# Patient Record
Sex: Female | Born: 1965 | Race: White | Hispanic: No | Marital: Married | Smoking: Former smoker
Health system: Southern US, Community
[De-identification: ages and names within clinical notes are randomized; demographics above are authoritative.]

## PROBLEM LIST (undated history)

## (undated) DIAGNOSIS — F419 Anxiety disorder, unspecified: Secondary | ICD-10-CM

## (undated) DIAGNOSIS — F329 Major depressive disorder, single episode, unspecified: Secondary | ICD-10-CM

## (undated) DIAGNOSIS — G43909 Migraine, unspecified, not intractable, without status migrainosus: Secondary | ICD-10-CM

## (undated) DIAGNOSIS — T7840XA Allergy, unspecified, initial encounter: Secondary | ICD-10-CM

## (undated) DIAGNOSIS — Z973 Presence of spectacles and contact lenses: Secondary | ICD-10-CM

## (undated) DIAGNOSIS — N939 Abnormal uterine and vaginal bleeding, unspecified: Secondary | ICD-10-CM

## (undated) DIAGNOSIS — Z975 Presence of (intrauterine) contraceptive device: Secondary | ICD-10-CM

## (undated) DIAGNOSIS — M26609 Unspecified temporomandibular joint disorder, unspecified side: Secondary | ICD-10-CM

## (undated) DIAGNOSIS — F32A Depression, unspecified: Secondary | ICD-10-CM

## (undated) DIAGNOSIS — E0510 Thyrotoxicosis with toxic single thyroid nodule without thyrotoxic crisis or storm: Secondary | ICD-10-CM

## (undated) HISTORY — DX: Major depressive disorder, single episode, unspecified: F32.9

## (undated) HISTORY — PX: FRACTURE SURGERY: SHX138

## (undated) HISTORY — DX: Allergy, unspecified, initial encounter: T78.40XA

## (undated) HISTORY — DX: Presence of (intrauterine) contraceptive device: Z97.5

## (undated) HISTORY — DX: Depression, unspecified: F32.A

## (undated) HISTORY — PX: WISDOM TOOTH EXTRACTION: SHX21

## (undated) HISTORY — DX: Unspecified temporomandibular joint disorder, unspecified side: M26.609

## (undated) HISTORY — DX: Anxiety disorder, unspecified: F41.9

---

## 2008-04-30 ENCOUNTER — Other Ambulatory Visit: Admission: RE | Admit: 2008-04-30 | Discharge: 2008-04-30 | Payer: Self-pay | Admitting: Obstetrics & Gynecology

## 2008-07-12 DIAGNOSIS — Z975 Presence of (intrauterine) contraceptive device: Secondary | ICD-10-CM

## 2008-07-12 HISTORY — DX: Presence of (intrauterine) contraceptive device: Z97.5

## 2009-07-25 ENCOUNTER — Ambulatory Visit (HOSPITAL_COMMUNITY): Admission: RE | Admit: 2009-07-25 | Discharge: 2009-07-25 | Payer: Self-pay | Admitting: Obstetrics & Gynecology

## 2010-08-04 ENCOUNTER — Other Ambulatory Visit: Payer: Self-pay | Admitting: Obstetrics & Gynecology

## 2010-08-04 DIAGNOSIS — Z1231 Encounter for screening mammogram for malignant neoplasm of breast: Secondary | ICD-10-CM

## 2010-08-04 DIAGNOSIS — Z0000 Encounter for general adult medical examination without abnormal findings: Secondary | ICD-10-CM

## 2010-09-14 ENCOUNTER — Ambulatory Visit (HOSPITAL_COMMUNITY)
Admission: RE | Admit: 2010-09-14 | Discharge: 2010-09-14 | Disposition: A | Discharge status: Home / Self Care | Payer: BC Managed Care – PPO | Source: Ambulatory Visit | Attending: Obstetrics & Gynecology | Admitting: Obstetrics & Gynecology

## 2010-09-14 DIAGNOSIS — Z1231 Encounter for screening mammogram for malignant neoplasm of breast: Secondary | ICD-10-CM

## 2010-11-09 ENCOUNTER — Encounter: Payer: BC Managed Care – PPO | Attending: Obstetrics and Gynecology | Admitting: *Deleted

## 2010-11-09 DIAGNOSIS — R635 Abnormal weight gain: Secondary | ICD-10-CM | POA: Insufficient documentation

## 2010-11-09 DIAGNOSIS — Z713 Dietary counseling and surveillance: Secondary | ICD-10-CM | POA: Insufficient documentation

## 2010-12-19 ENCOUNTER — Other Ambulatory Visit: Payer: Self-pay | Admitting: Obstetrics & Gynecology

## 2010-12-19 DIAGNOSIS — Z1231 Encounter for screening mammogram for malignant neoplasm of breast: Secondary | ICD-10-CM

## 2011-01-01 ENCOUNTER — Ambulatory Visit: Payer: BC Managed Care – PPO | Admitting: *Deleted

## 2011-09-24 ENCOUNTER — Ambulatory Visit (HOSPITAL_COMMUNITY): Payer: BC Managed Care – PPO

## 2011-09-27 ENCOUNTER — Ambulatory Visit (HOSPITAL_COMMUNITY)
Admission: RE | Admit: 2011-09-27 | Discharge: 2011-09-27 | Disposition: A | Discharge status: Home / Self Care | Payer: BC Managed Care – PPO | Source: Ambulatory Visit | Attending: Obstetrics & Gynecology | Admitting: Obstetrics & Gynecology

## 2011-09-27 DIAGNOSIS — Z1231 Encounter for screening mammogram for malignant neoplasm of breast: Secondary | ICD-10-CM | POA: Insufficient documentation

## 2012-07-31 ENCOUNTER — Encounter: Payer: Self-pay | Admitting: *Deleted

## 2012-08-01 ENCOUNTER — Encounter: Payer: Self-pay | Admitting: *Deleted

## 2012-08-04 ENCOUNTER — Other Ambulatory Visit: Payer: Self-pay | Admitting: Certified Nurse Midwife

## 2012-08-04 DIAGNOSIS — Z1231 Encounter for screening mammogram for malignant neoplasm of breast: Secondary | ICD-10-CM

## 2012-09-30 ENCOUNTER — Ambulatory Visit (HOSPITAL_COMMUNITY)
Admission: RE | Admit: 2012-09-30 | Discharge: 2012-09-30 | Disposition: A | Discharge status: Home / Self Care | Payer: BC Managed Care – PPO | Source: Ambulatory Visit | Attending: Certified Nurse Midwife | Admitting: Certified Nurse Midwife

## 2012-09-30 ENCOUNTER — Encounter: Payer: Self-pay | Admitting: Certified Nurse Midwife

## 2012-09-30 ENCOUNTER — Other Ambulatory Visit: Payer: Self-pay | Admitting: Certified Nurse Midwife

## 2012-09-30 ENCOUNTER — Ambulatory Visit (INDEPENDENT_AMBULATORY_CARE_PROVIDER_SITE_OTHER): Payer: BC Managed Care – PPO | Admitting: Certified Nurse Midwife

## 2012-09-30 VITALS — BP 120/70 | Ht 63.75 in | Wt 149.0 lb

## 2012-09-30 DIAGNOSIS — Z1231 Encounter for screening mammogram for malignant neoplasm of breast: Secondary | ICD-10-CM | POA: Insufficient documentation

## 2012-09-30 DIAGNOSIS — R599 Enlarged lymph nodes, unspecified: Secondary | ICD-10-CM

## 2012-09-30 LAB — CBC WITH DIFFERENTIAL/PLATELET
Basophils Relative: 1 % (ref 0–1)
Eosinophils Relative: 1 % (ref 0–5)
HCT: 36.2 % (ref 36.0–46.0)
Hemoglobin: 12.3 g/dL (ref 12.0–15.0)
Lymphocytes Relative: 23 % (ref 12–46)
MCHC: 34 g/dL (ref 30.0–36.0)
MCV: 86.2 fL (ref 78.0–100.0)
Monocytes Absolute: 0.4 10*3/uL (ref 0.1–1.0)
Monocytes Relative: 8 % (ref 3–12)
Neutro Abs: 3.8 10*3/uL (ref 1.7–7.7)
RDW: 13.7 % (ref 11.5–15.5)

## 2012-09-30 LAB — POCT URINALYSIS DIPSTICK
Bilirubin, UA: NEGATIVE
Blood, UA: NEGATIVE
Glucose, UA: NEGATIVE

## 2012-09-30 MED ORDER — VITAMIN D (ERGOCALCIFEROL) 1.25 MG (50000 UNIT) PO CAPS: 50000.0000 [IU] | ORAL_CAPSULE | ORAL | Status: DC

## 2012-09-30 NOTE — Patient Instructions (Signed)
Swollen Lymph Nodes The lymphatic system filters fluid from around cells. It is like a system of blood vessels. These channels carry lymph instead of blood. The lymphatic system is an important part of the immune (disease fighting) system. When people talk about "swollen glands in the neck," they are usually talking about swollen lymph nodes. The lymph nodes are like the little traps for infection. You and your caregiver may be able to feel lymph nodes, especially swollen nodes, in these common areas: the groin (inguinal area), armpits (axilla), and above the clavicle (supraclavicular). You may also feel them in the neck (cervical) and the back of the head just above the hairline (occipital). Swollen glands occur when there is any condition in which the body responds with an allergic type of reaction. For instance, the glands in the neck can become swollen from insect bites or any type of minor infection on the head. These are very noticeable in children with only minor problems. Lymph nodes may also become swollen when there is a tumor or problem with the lymphatic system, such as Hodgkin's disease. TREATMENT   Most swollen glands do not require treatment. They can be observed (watched) for a short period of time, if your caregiver feels it is necessary. Most of the time, observation is not necessary.  Antibiotics (medicines that kill germs) may be prescribed by your caregiver. Your caregiver may prescribe these if he or she feels the swollen glands are due to a bacterial (germ) infection. Antibiotics are not used if the swollen glands are caused by a virus. HOME CARE INSTRUCTIONS   Take medications as directed by your caregiver. Only take over-the-counter or prescription medicines for pain, discomfort, or fever as directed by your caregiver. SEEK MEDICAL CARE IF:   If you begin to run a temperature greater than 102 F (38.9 C), or as your caregiver suggests. MAKE SURE YOU:   Understand these  instructions.  Will watch your condition.  Will get help right away if you are not doing well or get worse. Document Released: 06/22/2002 Document Revised: 09/24/2011 Document Reviewed: 07/02/2005 [facility] Patient Information 2013 [place], [place].

## 2012-09-30 NOTE — Addendum Note (Signed)
Addended by: Verner Chol on: 09/30/2012 04:06 PM   Modules accepted: Orders

## 2012-09-30 NOTE — Progress Notes (Addendum)
47 y.o. MarriedCaucasian female   [phone] here for annual exam. Noted right inguinal lymph node enlarged.  Denies soreness, tenderness or redness.  Shaves once weekly. No other health issues today   Patient's last menstrual period was 09/28/2012.,usually scant with IUD          Sexually active: yes  The current method of family planning is IUD.    Exercising: yes  The patient has a physically strenuous job, but has no regular exercise apart from work.  Hormones:none   reports that she has quit smoking. She does not have any smokeless tobacco history on file. She reports that she does not drink alcohol or use illicit drugs.  Health Maintenance  Topic Date Due  . Influenza Vaccine  03/16/1969  . Pap Smear  07/10/1986  . Tetanus/tdap  07/11/1987    Family History  Problem Relation Age of Onset  . Breast cancer Mother   . Diabetes Paternal Grandfather   . Kidney cancer Paternal Grandmother   . Hypertension Paternal Grandfather   . Hypertension Paternal Grandmother   . Hypertension Father   . COPD Mother     There is no problem list on file for this patient.   Past Medical History  Diagnosis Date  . IUD (intrauterine device) in place 07/12/2008    Expires 07/12/2013  . Anxiety   . Depression     Past Surgical History  Procedure Laterality Date  . Cesarean section      two of them    Allergies: Review of patient's allergies indicates no known allergies.  Current Outpatient Prescriptions  Medication Sig Dispense Refill  . Acetaminophen (TYLENOL PO) Take by mouth as needed.      . B Complex Vitamins (B COMPLEX PO) Take by mouth as needed.      Marland Kitchen CALCIUM PO Take by mouth.      . Cyanocobalamin (B-12 PO) Take by mouth.      . levonorgestrel (MIRENA) 20 MCG/24HR IUD 1 each by Intrauterine route once.      Marland Kitchen MAGNESIUM PO Take by mouth.      . Multiple Vitamins-Minerals (MULTIVITAMIN PO) Take by mouth.      . Omega-3 Fatty Acids (FISH OIL PO) Take by mouth.      .  Vitamin D, Ergocalciferol, (DRISDOL) 50000 UNITS CAPS Take 50,000 Units by mouth once a week.       No current facility-administered medications for this visit.    ROS: Pertinent items are noted in HPI.  Exam:    BP 120/70  Ht 5' 3.75" (1.619 m)  Wt 149 lb (67.586 kg)  BMI 25.78 kg/m2  LMP 09/28/2012  Weight change: @WEIGHTCHANGE @ Height:   Height: 5' 3.75" (161.9 cm)   General appearance: alert, cooperative and appears stated age Head: Normocephalic, without obvious abnormality, atraumatic Neck: no adenopathy, supple, symmetrical, trachea midline and thyroid not enlarged, symmetric, no tenderness/mass/nodules Lungs: clear to auscultation bilaterally Breasts: Inspection negative, No nipple retraction or dimpling, No nipple discharge or bleeding, No axillary or supraclavicular adenopathy, Normal to palpation without dominant masses Heart: regular rate and rhythm Abdomen: soft, non-tender; bowel sounds normal; no masses,  no organomegaly Extremities: extremities normal, atraumatic, no cyanosis or edema Skin: Skin color, texture, turgor normal. No rashes or lesions Lymph nodes: Cervical, supraclavicular, and axillary nodes normal. No abnormal inguinal nodes palpated,Right inquinal lymph node slightly enlarged, non-tender. Pubic area shaved recently Neurologic: Grossly normal   Pelvic: External genitalia:  no lesions  Urethra:  normal appearing urethra with no masses, tenderness or lesions              Bartholins and Skenes: normal                 Vagina: normal appearing vagina with normal color and discharge, no lesions              Cervix: normal appearance, IUD string noted in cervical os              Pap taken: no        Bimanual Exam:  Uterus:  uterus is normal size, shape, consistency and nontender                                      Adnexa: normal adnexa in size, nontender and no masses                                      Rectovaginal: Confirms                                       Anus:  normal sphincter tone, no lesions  A: well woman Exam Right inguinal lymph node slight enlargement probably second degree to shaving History of Vitamin D deficiency      P:Wellness information reviewed Epsom salt soak to inguinal area, notify if not resolved in one week, avoid shaving CBC with diff Vitamin D  mammogram return annually or prn     An After Visit Summary was printed and given to the patient.  Chart reviewed CPRomine, MD

## 2012-10-01 LAB — HEMOGLOBIN, FINGERSTICK: Hemoglobin, fingerstick: 12.4 g/dL (ref 12.0–16.0)

## 2012-10-08 ENCOUNTER — Telehealth: Payer: Self-pay

## 2012-10-08 NOTE — Telephone Encounter (Signed)
Patient notified of normal cbc with diff results. Pt also notified of vitamin d results & told to take between 600-800 iu of vitamin d daily.

## 2013-03-26 ENCOUNTER — Telehealth: Payer: Self-pay | Admitting: Certified Nurse Midwife

## 2013-03-26 NOTE — Telephone Encounter (Signed)
Patient is calling about a billing issue with an annual back in March.

## 2013-04-22 NOTE — Telephone Encounter (Signed)
Patient is calling for Mary Bolton she wanted to let you know that her insurance has mailed payment to [place] for the billing issue.

## 2013-05-21 ENCOUNTER — Other Ambulatory Visit: Payer: Self-pay

## 2013-05-28 ENCOUNTER — Telehealth: Payer: Self-pay | Admitting: Certified Nurse Midwife

## 2013-05-28 NOTE — Telephone Encounter (Signed)
Patient wants to make an appt to come in and have her old IUD removed and a new on replaced.

## 2013-05-28 NOTE — Telephone Encounter (Signed)
Mary Bolton, patient is due for Mirena Removal on 12/28. She is requesting appointment for removal and reinsert. Is this okay to schedule? Last AEX 09/30/12.

## 2013-05-29 NOTE — Telephone Encounter (Signed)
Spoke with patient. Appointment scheduled and instructions given.  Motrin instructions given.   Motrin=Advil=Ibuprofen  800 mg one hour before procedure. Eat lunch and hydrate well before appointment.   Carolynn can you precert.

## 2013-05-29 NOTE — Telephone Encounter (Signed)
Message left to return call to [place] at [phone].   Will schedule IUD Removal and Reinsertion at return call.

## 2013-05-29 NOTE — Telephone Encounter (Signed)
Pt is returning a call to Vevay. She is at work and asking to be called back between 4-5pm today.

## 2013-05-29 NOTE — Telephone Encounter (Signed)
yes

## 2013-06-01 ENCOUNTER — Other Ambulatory Visit: Payer: Self-pay | Admitting: Certified Nurse Midwife

## 2013-06-01 DIAGNOSIS — Z30433 Encounter for removal and reinsertion of intrauterine contraceptive device: Secondary | ICD-10-CM

## 2013-06-01 NOTE — Telephone Encounter (Signed)
done

## 2013-06-01 NOTE — Telephone Encounter (Signed)
Mary Bolton,   Please place an order for insertion. I have to have an order in the workqueue in order to precert the patient. I do not use the insurance forms anymore (all info goes in EPIC).   Thanks

## 2013-07-07 ENCOUNTER — Ambulatory Visit (INDEPENDENT_AMBULATORY_CARE_PROVIDER_SITE_OTHER): Payer: BC Managed Care – PPO | Admitting: Certified Nurse Midwife

## 2013-07-07 ENCOUNTER — Encounter: Payer: Self-pay | Admitting: Certified Nurse Midwife

## 2013-07-07 VITALS — BP 130/84 | HR 72 | Resp 16 | Ht 63.75 in | Wt 143.0 lb

## 2013-07-07 DIAGNOSIS — N760 Acute vaginitis: Secondary | ICD-10-CM

## 2013-07-07 DIAGNOSIS — Z30433 Encounter for removal and reinsertion of intrauterine contraceptive device: Secondary | ICD-10-CM

## 2013-07-07 MED ORDER — METRONIDAZOLE 0.75 % VA GEL: VAGINAL | Status: DC

## 2013-07-07 NOTE — Progress Notes (Signed)
47 y.o. Married Caucasian female G2P2002 here removal and insertion of Mirena IUD. Mirena due for removal 07/12/13. Patient had noted increase vaginal discharge in past few days, light odor, no itching. No new personal products. Patient had read and signed consent for IUD removal and reinsertion. Last aex 3/14.  O: Healthy WD,WN female Affect:normal, orientation x 3  Skin:warm and dry Abdomen: non tender Pelvic exam:EXTERNAL GENITALIA: normal appearing vulva with no masses, tenderness or lesions VAGINA: discharge grey, watery with odor, Wet Prep/KOH positive clue cells and ph 5.5 CERVIX: no lesions or cervical motion tenderness and cervix nulliparous appearance with no IUD string seen, could  Not pass pap brush to try to sweep string out. Os very tight. Dr Farrel Gobble consulted and agrees that patient needs BV treated and will need Cytotec prior to attempt to retrieve IUD and may need PUS for string location.  UTERUS: mid and normal, non tender ADNEXA: no masses palpable and non tender  A:Mirena IUD removal and insertion cancelled due to unable to locate string with cervical appearance BV  P: Discussed findings of IUD string not seen and  BV present and could not place until treated. Patient voiced understanding. Rx Metrogel see order. Patient will notify when completed medication and will reschedule IUD removal and new insertion. Patient will need Cytotec prior to procedure. Patient understands, importance and possible PUS maybe needed. IUD will be schedule with Dr Farrel Gobble   RV as above

## 2013-07-08 NOTE — Patient Instructions (Signed)
Bacterial Vaginosis Bacterial vaginosis (BV) is a vaginal infection where the normal balance of bacteria in the vagina is disrupted. The normal balance is then replaced by an overgrowth of certain bacteria. There are several different kinds of bacteria that can cause BV. BV is the most common vaginal infection in women of childbearing age. CAUSES   The cause of BV is not fully understood. BV develops when there is an increase or imbalance of harmful bacteria.  Some activities or behaviors can upset the normal balance of bacteria in the vagina and put women at increased risk including:  Having a new sex partner or multiple sex partners.  Douching.  Using an intrauterine device (IUD) for contraception.  It is not clear what role sexual activity plays in the development of BV. However, women that have never had sexual intercourse are rarely infected with BV. Women do not get BV from toilet seats, bedding, swimming pools or from touching objects around them.  SYMPTOMS   Grey vaginal discharge.  A fish-like odor with discharge, especially after sexual intercourse.  Itching or burning of the vagina and vulva.  Burning or pain with urination.  Some women have no signs or symptoms at all. DIAGNOSIS  Your caregiver must examine the vagina for signs of BV. Your caregiver will perform lab tests and look at the sample of vaginal fluid through a microscope. They will look for bacteria and abnormal cells (clue cells), a pH test higher than 4.5, and a positive amine test all associated with BV.  RISKS AND COMPLICATIONS   Pelvic inflammatory disease (PID).  Infections following gynecology surgery.  Developing HIV.  Developing herpes virus. TREATMENT  Sometimes BV will clear up without treatment. However, all women with symptoms of BV should be treated to avoid complications, especially if gynecology surgery is planned. Female partners generally do not need to be treated. However, BV may spread  between female sex partners so treatment is helpful in preventing a recurrence of BV.   BV may be treated with antibiotics. The antibiotics come in either pill or vaginal cream forms. Either can be used with nonpregnant or pregnant women, but the recommended dosages differ. These antibiotics are not harmful to the baby.  BV can recur after treatment. If this happens, a second round of antibiotics will often be prescribed.  Treatment is important for pregnant women. If not treated, BV can cause a premature delivery, especially for a pregnant woman who had a premature birth in the past. All pregnant women who have symptoms of BV should be checked and treated.  For chronic reoccurrence of BV, treatment with a type of prescribed gel vaginally twice a week is helpful. HOME CARE INSTRUCTIONS   Finish all medication as directed by your caregiver.  Do not have sex until treatment is completed.  Tell your sexual partner that you have a vaginal infection. They should see their caregiver and be treated if they have problems, such as a mild rash or itching.  Practice safe sex. Use condoms. Only have 1 sex partner. PREVENTION  Basic prevention steps can help reduce the risk of upsetting the natural balance of bacteria in the vagina and developing BV:  Do not have sexual intercourse (be abstinent).  Do not douche.  Use all of the medicine prescribed for treatment of BV, even if the signs and symptoms go away.  Tell your sex partner if you have BV. That way, they can be treated, if needed, to prevent reoccurrence. SEEK MEDICAL CARE IF:     Your symptoms are not improving after 3 days of treatment.  You have increased discharge, pain, or fever. MAKE SURE YOU:   Understand these instructions.  Will watch your condition.  Will get help right away if you are not doing well or get worse. FOR MORE INFORMATION  Division of STD Prevention (DSTDP), Centers for Disease Control and Prevention:  www.cdc.gov/std American Social Health Association (ASHA): www.ashastd.org  Document Released: 07/02/2005 Document Revised: 09/24/2011 Document Reviewed: 02/11/2013 [employer] Patient Information 2014 [employer].  

## 2013-07-13 ENCOUNTER — Telehealth: Payer: Self-pay | Admitting: Certified Nurse Midwife

## 2013-07-13 DIAGNOSIS — Z3049 Encounter for surveillance of other contraceptives: Secondary | ICD-10-CM

## 2013-07-13 MED ORDER — MISOPROSTOL 200 MCG PO TABS: ORAL_TABLET | ORAL | Status: DC

## 2013-07-13 NOTE — Telephone Encounter (Signed)
Pt would like an appointment to have an iud removed and replaced.

## 2013-07-13 NOTE — Telephone Encounter (Signed)
Yes.  Encounter closed. 

## 2013-07-13 NOTE — Telephone Encounter (Signed)
Patient calling to schedule IUD replacement.  Has completed Metrogel.  Was told needed medication or cervix.  Patient was seen by Debbi last week for this but string was hard to see and patient had BV so was treated with Metrogel.  Instructed Cytotec will be sent to pharmacy. Instructed to insert one table vaginally this PM and in am.  Instructed may have cramping. Mortin 800 mg 1 hour prior to appointment with food.   Routing to provider for final review. Patient agreeable to disposition.   Agree?

## 2013-07-13 NOTE — Progress Notes (Signed)
Reviewed personally.  M. Suzanne Milly Goggins, MD.  

## 2013-07-14 ENCOUNTER — Ambulatory Visit (INDEPENDENT_AMBULATORY_CARE_PROVIDER_SITE_OTHER): Payer: BC Managed Care – PPO | Admitting: Obstetrics & Gynecology

## 2013-07-14 VITALS — BP 160/94 | HR 96 | Temp 98.9°F | Wt 142.2 lb

## 2013-07-14 DIAGNOSIS — Z975 Presence of (intrauterine) contraceptive device: Secondary | ICD-10-CM | POA: Insufficient documentation

## 2013-07-14 DIAGNOSIS — Z3049 Encounter for surveillance of other contraceptives: Secondary | ICD-10-CM

## 2013-07-14 DIAGNOSIS — Z30433 Encounter for removal and reinsertion of intrauterine contraceptive device: Secondary | ICD-10-CM

## 2013-07-14 NOTE — Patient Instructions (Signed)

## 2013-07-14 NOTE — Progress Notes (Signed)
47 y.o. G84P2002 Married Caucasian female presents for removal of and insertion of new Mirena.  This will be her third IUD.  She has done well with the IUD.  She has eseentially no bleeding with the mirena.  She only spots if she is under a lot of stress.  Currently, she denies any vaginal symptoms or STD concerns.  LMP:  Random spotting with IUD  Healthy female:  WNWD WF NAD Abdomen: soft, non-tender  Pelvic exam: Vulva:  normal female genitalia Vagina:normal vagina Cervix:Non-tender, Negative CMT, no lesions or redness, os small, no IUD string noted Uterus:normal shape, position and consistency   After patient read information booklet and all questions were answered, informed consent was obtained.    Procedure:  Speculum inserted into vagina. Cervix visualized and cleansed with betadine solution X 3. Paracervical block was not placed.  Tenaculum placed on cervix at 12 o'clock position.  Cervix dilated with Milex dilator.  String located with string finder.  IUD removed with one pull.  Pt did not want to look at the IUD before discarding.  Uterus sounded to 8 centimeters.  IUD removed from sterile packet and under sterile conditions inserted to fundus of uterus.  Introducer removed without difficulty.  IUD string trimmed to 2 centimeters.  Remainder string given to patient to feel for identification.  Tenaculum removed.  MInimal bleeding noted.  Speculum removed.  Uterus palpated normal.  Patient tolerated procedure well.  IUD Lot #:[id number].  Exp: 8/16.  Package information attached to consent and scanned into EPIC.  A: Insertion of Mirena   P: Return to office 6 weeks for recheck Pt knows IUD needs to be replaced approximately 07/14/18.  Instructions provided.  Card given.

## 2013-07-29 ENCOUNTER — Encounter: Payer: Self-pay | Admitting: Certified Nurse Midwife

## 2013-08-04 ENCOUNTER — Other Ambulatory Visit: Payer: Self-pay | Admitting: Obstetrics & Gynecology

## 2013-08-04 DIAGNOSIS — Z1231 Encounter for screening mammogram for malignant neoplasm of breast: Secondary | ICD-10-CM

## 2013-08-31 ENCOUNTER — Telehealth: Payer: Self-pay | Admitting: Obstetrics & Gynecology

## 2013-08-31 ENCOUNTER — Ambulatory Visit: Payer: BC Managed Care – PPO | Admitting: Obstetrics & Gynecology

## 2013-08-31 NOTE — Telephone Encounter (Signed)
That is fine to wait since she is without problems.  Let her know to call if anything is abnormal/out of the ordinary.  We can always see her then.  Thanks.  Encounter closed.

## 2013-08-31 NOTE — Telephone Encounter (Signed)
Patient calling to see if it is okay for her to cancel her IUD recheck appointment for today due to inclement weather and have the IUD recheck at her AEX 10/02/13? If not, she will keep her appointment today, weather permitting.

## 2013-08-31 NOTE — Telephone Encounter (Signed)
Spoke with pt about cancelling appt today. Pt lives in [place] and doesn't want to risk getting caught in bad weather. Pt reports no problems with IUD. No pain or bleeding. This is pt's third IUD, and she says it is just like the other 2 she's had. Advised it is probably fine just to wait until her Aex 10-02-13 for a recheck, but I would check with Dr. Sabra Heck to be sure. OK for pt to wait until Aex for IUD recheck?

## 2013-09-01 NOTE — Telephone Encounter (Signed)
LM on VM (first and last name confirmation on VM). LM dr Sabra Heck agreeable for recheck at AEX.  Call back if any problems before then.

## 2013-10-01 ENCOUNTER — Ambulatory Visit: Payer: BC Managed Care – PPO | Admitting: Certified Nurse Midwife

## 2013-10-02 ENCOUNTER — Encounter: Payer: Self-pay | Admitting: Obstetrics & Gynecology

## 2013-10-02 ENCOUNTER — Other Ambulatory Visit: Payer: Self-pay | Admitting: Obstetrics & Gynecology

## 2013-10-02 ENCOUNTER — Ambulatory Visit (HOSPITAL_COMMUNITY): Payer: BC Managed Care – PPO

## 2013-10-02 ENCOUNTER — Ambulatory Visit (INDEPENDENT_AMBULATORY_CARE_PROVIDER_SITE_OTHER): Payer: BC Managed Care – PPO | Admitting: Obstetrics & Gynecology

## 2013-10-02 VITALS — BP 140/88 | HR 93 | Resp 18 | Ht 64.0 in | Wt 144.0 lb

## 2013-10-02 DIAGNOSIS — Z124 Encounter for screening for malignant neoplasm of cervix: Secondary | ICD-10-CM

## 2013-10-02 DIAGNOSIS — Z01419 Encounter for gynecological examination (general) (routine) without abnormal findings: Secondary | ICD-10-CM

## 2013-10-02 DIAGNOSIS — Z0000 Encounter for general adult medical examination without abnormal findings: Secondary | ICD-10-CM

## 2013-10-02 LAB — HEMOGLOBIN, FINGERSTICK: HEMOGLOBIN, FINGERSTICK: 13.2 g/dL (ref 12.0–16.0)

## 2013-10-02 LAB — COMPREHENSIVE METABOLIC PANEL
ALK PHOS: 76 U/L (ref 39–117)
ALT: 17 U/L (ref 0–35)
AST: 23 U/L (ref 0–37)
Albumin: 4.3 g/dL (ref 3.5–5.2)
BILIRUBIN TOTAL: 0.5 mg/dL (ref 0.2–1.2)
BUN: 9 mg/dL (ref 6–23)
CO2: 30 mEq/L (ref 19–32)
CREATININE: 0.8 mg/dL (ref 0.50–1.10)
Calcium: 9.5 mg/dL (ref 8.4–10.5)
Chloride: 97 mEq/L (ref 96–112)
Glucose, Bld: 169 mg/dL — ABNORMAL HIGH (ref 70–99)
Potassium: 3.2 mEq/L — ABNORMAL LOW (ref 3.5–5.3)
Sodium: 136 mEq/L (ref 135–145)
Total Protein: 7.3 g/dL (ref 6.0–8.3)

## 2013-10-02 LAB — LIPID PANEL
CHOL/HDL RATIO: 2.6 ratio
CHOLESTEROL: 169 mg/dL (ref 0–200)
HDL: 65 mg/dL (ref >39)
LDL CALC: 89 mg/dL (ref 0–99)
Triglycerides: 76 mg/dL (ref <150)
VLDL: 15 mg/dL (ref 0–40)

## 2013-10-02 LAB — POCT URINALYSIS DIPSTICK
Bilirubin, UA: NEGATIVE
Blood, UA: NEGATIVE
GLUCOSE UA: NEGATIVE
Ketones, UA: NEGATIVE
LEUKOCYTES UA: NEGATIVE
NITRITE UA: NEGATIVE
Protein, UA: NEGATIVE
UROBILINOGEN UA: NEGATIVE
pH, UA: 5

## 2013-10-02 LAB — TSH: TSH: 0.897 u[IU]/mL (ref 0.350–4.500)

## 2013-10-02 NOTE — Progress Notes (Signed)
48 y.o. [id number] MarriedCaucasianF here for annual exam.  Doing well.  Pretty sure she had a tetanus shot with pertussis at PCP's office within the last three years.  Not really bleeding at all.  Has Mirena IUD 07/14/13.    Working on weight loss.  Gained weight due to stress and family illnesses.  Now really careful with diet.  Has increased exercise.  Wants to lose 5-10 more pounds.    No LMP recorded. Patient is not currently having periods (Reason: IUD).          Sexually active: yes  The current method of family planning is IUD.    Exercising: no  The patient does not participate in regular exercise at present. Smoker:  no  Health Maintenance: Pap:  09/2011 - Neg. HR HPV: Neg. History of abnormal Pap:  no MMG:  09/2012 BI-RADS 1 : Neg, had MMG scheduled today but equipment went down.  She will call to reschedule. Colonoscopy:  Never BMD:   Never TDaP:  She thinks within the last three years Screening Labs: today, Hb today: 13.2, Urine today: All neg.   reports that she has quit smoking. She has never used smokeless tobacco. She reports that she does not drink alcohol or use illicit drugs.  Past Medical History  Diagnosis Date  . IUD (intrauterine device) in place 07/12/2008    Expires 07/12/2013  . Anxiety   . Depression   . IUD (intrauterine device) in place 07/14/2013    Exp. 07/14/2018  . TMJ (temporomandibular joint disorder)     Past Surgical History  Procedure Laterality Date  . Cesarean section      two of them    Current Outpatient Prescriptions  Medication Sig Dispense Refill  . Acetaminophen (TYLENOL PO) Take by mouth as needed.      Marland Kitchen CALCIUM PO Take by mouth.      . Cholecalciferol (VITAMIN D PO) Take by mouth daily.      Marland Kitchen levonorgestrel (MIRENA) 20 MCG/24HR IUD 1 each by Intrauterine route once.      Marland Kitchen MAGNESIUM PO Take by mouth.      . Multiple Vitamins-Minerals (MULTIVITAMIN PO) Take by mouth.      . Omega-3 Fatty Acids (FISH OIL PO) Take by mouth.        No current facility-administered medications for this visit.    Family History  Problem Relation Age of Onset  . Breast cancer Mother   . COPD Mother   . Diabetes Paternal Grandfather   . Hypertension Paternal Grandfather   . Kidney cancer Paternal Grandfather   . Stroke Paternal Grandfather   . Hypertension Paternal Grandmother   . Hypertension Father     ROS:  Pertinent items are noted in HPI.  Otherwise, a comprehensive ROS was negative.  Exam:   BP 140/88  Pulse 93  Resp 18  Ht 5\' 4"  (1.626 m)  Wt 144 lb (65.318 kg)  BMI 24.71 kg/m2  Weight change: -32#  Height: 5\' 4"  (162.6 cm)  Ht Readings from Last 3 Encounters:  10/02/13 5\' 4"  (1.626 m)  07/07/13 5' 3.75" (1.619 m)  09/30/12 5' 3.75" (1.619 m)    General appearance: alert, cooperative and appears stated age Head: Normocephalic, without obvious abnormality, atraumatic Neck: no adenopathy, supple, symmetrical, trachea midline and thyroid normal to inspection and palpation Lungs: clear to auscultation bilaterally Breasts: normal appearance, no masses or tenderness Heart: regular rate and rhythm Abdomen: soft, non-tender; bowel sounds normal; no masses,  no  organomegaly Extremities: extremities normal, atraumatic, no cyanosis or edema Skin: Skin color, texture, turgor normal. No rashes or lesions Lymph nodes: Cervical, supraclavicular, and axillary nodes normal. No abnormal inguinal nodes palpated Neurologic: Grossly normal   Pelvic: External genitalia:  no lesions              Urethra:  normal appearing urethra with no masses, tenderness or lesions              Bartholins and Skenes: normal                 Vagina: normal appearing vagina with normal color and discharge, no lesions              Cervix: no lesions, very short string noted at cervical os              Pap taken: no Bimanual Exam:  Uterus:  normal size, contour, position, consistency, mobility, non-tender              Adnexa: normal adnexa  and no mass, fullness, tenderness               Rectovaginal: Confirms               Anus:  normal sphincter tone, no lesions  A:  Well Woman with normal exam Second Mirena IUD placed 12/14. H/O anxiety/depression H/O Vit D deficiency  P:   Mammogram due.  Pt aware and will scheduled. pap smear only obtained.  H/O neg pap with neg HR HPV 3/13. Vit D, CMP, Lipids, TSH today return annually or prn  An After Visit Summary was printed and given to the patient.

## 2013-10-03 LAB — VITAMIN D 25 HYDROXY (VIT D DEFICIENCY, FRACTURES): VIT D 25 HYDROXY: 47 ng/mL (ref 30–89)

## 2013-10-06 ENCOUNTER — Ambulatory Visit: Payer: BC Managed Care – PPO | Admitting: Certified Nurse Midwife

## 2013-10-06 LAB — IPS PAP SMEAR ONLY

## 2013-10-07 LAB — HEMOGLOBIN A1C
Hgb A1c MFr Bld: 5.4 % (ref <5.7)
Mean Plasma Glucose: 108 mg/dL (ref <117)

## 2013-10-08 ENCOUNTER — Telehealth: Payer: Self-pay | Admitting: Obstetrics & Gynecology

## 2013-10-08 NOTE — Telephone Encounter (Signed)
Left message to call [facility] at [phone].    Notes Recorded by Maryann Conners, CMA on 10/08/2013 at 9:56 AM Left message to return call. ------  Notes Recorded by Lyman Speller, MD on 10/08/2013 at 9:02 AM Please inform CMP showed mildly low potassium (which is nothing to worry about as it just barely outside normal) but blood sugar was elevated. HbA1C normal so this indicated she does not have diabetes. Has probably eaten something sweet between visit (or something sweet to drink like tea). Lipids normal. TSH normal. Vit D normal.  Labs not released to mychart due to abnormal but if she wants them, I will release once she has been called.

## 2013-10-08 NOTE — Telephone Encounter (Signed)
Spoke with patient. Message below from Kimball given. Patient verbalizes understanding and agreeable.  Routing to provider for final review. Patient agreeable to disposition. Will close encounter

## 2013-10-08 NOTE — Telephone Encounter (Signed)
Patient is calling kelly back i do not see a note

## 2014-05-17 ENCOUNTER — Encounter: Payer: Self-pay | Admitting: Obstetrics & Gynecology

## 2014-12-15 ENCOUNTER — Other Ambulatory Visit: Payer: Self-pay | Admitting: Obstetrics & Gynecology

## 2014-12-15 DIAGNOSIS — Z1231 Encounter for screening mammogram for malignant neoplasm of breast: Secondary | ICD-10-CM

## 2014-12-31 ENCOUNTER — Ambulatory Visit (INDEPENDENT_AMBULATORY_CARE_PROVIDER_SITE_OTHER): Payer: BLUE CROSS/BLUE SHIELD | Admitting: Obstetrics & Gynecology

## 2014-12-31 ENCOUNTER — Encounter: Payer: Self-pay | Admitting: Obstetrics & Gynecology

## 2014-12-31 ENCOUNTER — Ambulatory Visit (HOSPITAL_COMMUNITY)
Admission: RE | Admit: 2014-12-31 | Discharge: 2014-12-31 | Disposition: A | Discharge status: Home / Self Care | Payer: BLUE CROSS/BLUE SHIELD | Source: Ambulatory Visit | Attending: Obstetrics & Gynecology | Admitting: Obstetrics & Gynecology

## 2014-12-31 ENCOUNTER — Other Ambulatory Visit: Payer: Self-pay | Admitting: Obstetrics & Gynecology

## 2014-12-31 VITALS — BP 130/84 | HR 60 | Resp 16 | Ht 64.0 in | Wt 146.0 lb

## 2014-12-31 DIAGNOSIS — Z0000 Encounter for general adult medical examination without abnormal findings: Secondary | ICD-10-CM | POA: Diagnosis not present

## 2014-12-31 DIAGNOSIS — Z01419 Encounter for gynecological examination (general) (routine) without abnormal findings: Secondary | ICD-10-CM

## 2014-12-31 DIAGNOSIS — Z1231 Encounter for screening mammogram for malignant neoplasm of breast: Secondary | ICD-10-CM | POA: Insufficient documentation

## 2014-12-31 LAB — COMPREHENSIVE METABOLIC PANEL
ALK PHOS: 60 U/L (ref 39–117)
ALT: 12 U/L (ref 0–35)
AST: 16 U/L (ref 0–37)
Albumin: 4.2 g/dL (ref 3.5–5.2)
BUN: 9 mg/dL (ref 6–23)
CO2: 26 meq/L (ref 19–32)
Calcium: 9.4 mg/dL (ref 8.4–10.5)
Chloride: 102 mEq/L (ref 96–112)
Creat: 0.75 mg/dL (ref 0.50–1.10)
GLUCOSE: 119 mg/dL — AB (ref 70–99)
Potassium: 3.6 mEq/L (ref 3.5–5.3)
SODIUM: 139 meq/L (ref 135–145)
Total Bilirubin: 0.7 mg/dL (ref 0.2–1.2)
Total Protein: 7 g/dL (ref 6.0–8.3)

## 2014-12-31 LAB — POCT URINALYSIS DIPSTICK
Blood, UA: NEGATIVE
Glucose, UA: NEGATIVE
Ketones, UA: NEGATIVE
LEUKOCYTES UA: NEGATIVE
Nitrite, UA: NEGATIVE
PH UA: 7
PROTEIN UA: NEGATIVE
UROBILINOGEN UA: NEGATIVE

## 2014-12-31 LAB — HEMOGLOBIN, FINGERSTICK: Hemoglobin, fingerstick: 12.5 g/dL (ref 12.0–16.0)

## 2014-12-31 NOTE — Addendum Note (Signed)
Addended by: Graylon Good on: 12/31/2014 02:19 PM   Modules accepted: Orders, SmartSet

## 2014-12-31 NOTE — Progress Notes (Signed)
49 y.o. [id number] MarriedCaucasianF here for annual exam.  Just got back from [facility] this week.   Doing well.  Doesn't cycle at all due to having a Mirena IUD.  Just had MMG today.    No LMP recorded. Patient is not currently having periods (Reason: IUD).          Sexually active: Yes.    The current method of family planning is IUD.   Mirena inserted 07/14/13. Exercising:  Yes.  Walking and swimming at least 4 days per week. Smoker:  no  Health Maintenance: Pap:  10/02/13, negative (neg HR HPV 2013) History of abnormal Pap:  no MMG:  Today, no results available at time of visit.  Previously 10/01/12, Bi-Rads 1:  Negative, [facility] Colonoscopy:  Never  BMD:   Never  TDaP:  2011 Screening Labs: 09/2013 Hb today: 12.5, Urine today: negative    reports that she has quit smoking. She has never used smokeless tobacco. She reports that she does not drink alcohol or use illicit drugs.  Past Medical History  Diagnosis Date  . IUD (intrauterine device) in place 07/12/2008    Expires 07/12/2013  . Anxiety   . Depression   . IUD (intrauterine device) in place 07/14/2013    Exp. 07/14/2018  . TMJ (temporomandibular joint disorder)     Past Surgical History  Procedure Laterality Date  . Cesarean section      two of them    Current Outpatient Prescriptions  Medication Sig Dispense Refill  . Acetaminophen (TYLENOL PO) Take by mouth as needed.    Marland Kitchen CALCIUM PO Take by mouth.    . Cholecalciferol (VITAMIN D PO) Take by mouth daily.    Marland Kitchen levonorgestrel (MIRENA) 20 MCG/24HR IUD 1 each by Intrauterine route once.    Marland Kitchen MAGNESIUM PO Take by mouth.    . Multiple Vitamins-Minerals (MULTIVITAMIN PO) Take by mouth.    . Omega-3 Fatty Acids (FISH OIL PO) Take by mouth.     No current facility-administered medications for this visit.    Family History  Problem Relation Age of Onset  . Breast cancer Mother   . COPD Mother   . Diabetes Paternal Grandfather   . Hypertension Paternal  Grandfather   . Kidney cancer Paternal Grandfather   . Stroke Paternal Grandfather   . Hypertension Paternal Grandmother   . Hypertension Father     ROS:  Pertinent items are noted in HPI.  Otherwise, a comprehensive ROS was negative.  Exam:   BP 130/84 mmHg  Pulse 60  Resp 16  Ht 5\' 4"  (1.626 m)  Wt 146 lb (66.225 kg)  BMI 25.05 kg/m2  Weight change: +2#  Height: 5\' 4"  (162.6 cm)  Ht Readings from Last 3 Encounters:  12/31/14 5\' 4"  (1.626 m)  10/02/13 5\' 4"  (1.626 m)  07/07/13 5' 3.75" (1.619 m)   General appearance: alert, cooperative and appears stated age Head: Normocephalic, without obvious abnormality, atraumatic Neck: no adenopathy, supple, symmetrical, trachea midline and thyroid normal to inspection and palpation Lungs: clear to auscultation bilaterally Breasts: normal appearance, no masses or tenderness Heart: regular rate and rhythm Abdomen: soft, non-tender; bowel sounds normal; no masses,  no organomegaly Extremities: extremities normal, atraumatic, no cyanosis or edema Skin: Skin color, texture, turgor normal. No rashes or lesions Lymph nodes: Cervical, supraclavicular, and axillary nodes normal. No abnormal inguinal nodes palpated Neurologic: Grossly normal   Pelvic: External genitalia:  no lesions  Urethra:  normal appearing urethra with no masses, tenderness or lesions              Bartholins and Skenes: normal                 Vagina: normal appearing vagina with normal color and discharge, no lesions              Cervix: no lesions, IUD string noted              Pap taken: No. Bimanual Exam:  Uterus:  normal size, contour, position, consistency, mobility, non-tender              Adnexa: normal adnexa and no mass, fullness, tenderness               Rectovaginal: Confirms               Anus:  normal sphincter tone, no lesions  Chaperone was present for exam.  A:  Well Woman with normal exam Mirena IUD placed 12/14.  This is the second  one. H/O anxiety/depression H/O Vit D deficiency  P: Mammogram yearly.  Done this morning.   No pap done today.  Neg pap 2015.  H/O neg pap with neg HR HPV 3/13. CMP drawn today. return annually or prn

## 2015-01-03 LAB — HEMOGLOBIN A1C
Hgb A1c MFr Bld: 5.5 % (ref <5.7)
Mean Plasma Glucose: 111 mg/dL (ref <117)

## 2015-08-18 ENCOUNTER — Ambulatory Visit (INDEPENDENT_AMBULATORY_CARE_PROVIDER_SITE_OTHER): Payer: BLUE CROSS/BLUE SHIELD | Admitting: "Endocrinology

## 2015-08-18 ENCOUNTER — Encounter: Payer: Self-pay | Admitting: "Endocrinology

## 2015-08-18 VITALS — BP 152/93 | HR 82 | Ht 64.0 in | Wt 140.0 lb

## 2015-08-18 DIAGNOSIS — E0510 Thyrotoxicosis with toxic single thyroid nodule without thyrotoxic crisis or storm: Secondary | ICD-10-CM | POA: Diagnosis not present

## 2015-08-18 NOTE — Progress Notes (Signed)
Subjective:    Patient ID: Mary Bolton, female    DOB: 1966/06/11, PCP Gar Ponto, MD.   Past Medical History  Diagnosis Date  . IUD (intrauterine device) in place 07/12/2008    Expires 07/12/2013  . Anxiety   . Depression   . IUD (intrauterine device) in place 07/14/2013    Exp. 07/14/2018  . TMJ (temporomandibular joint disorder)    Past Surgical History  Procedure Laterality Date  . Cesarean section      x 2   Social History   Social History  . Marital Status: Married    Spouse Name: N/A  . Number of Children: 2  . Years of Education: N/A   Occupational History  . self employed    Social History Main Topics  . Smoking status: Former Research scientist (life sciences)  . Smokeless tobacco: Never Used  . Alcohol Use: No  . Drug Use: No  . Sexual Activity:    Partners: Male    Birth Control/ Protection: IUD     Comment: mirena   Other Topics Concern  . None   Social History Narrative   Outpatient Encounter Prescriptions as of 08/18/2015  Medication Sig  . SUMAtriptan (IMITREX) 100 MG tablet Take 1 tablet by mouth daily as needed.  . topiramate (TOPAMAX) 25 MG tablet Take 25 mg by mouth 2 (two) times daily.  . [DISCONTINUED] Acetaminophen (TYLENOL PO) Take by mouth as needed.  . [DISCONTINUED] CALCIUM PO Take by mouth.  . [DISCONTINUED] levonorgestrel (MIRENA) 20 MCG/24HR IUD 1 each by Intrauterine route once.  . [DISCONTINUED] MAGNESIUM PO Take by mouth.  . [DISCONTINUED] Multiple Vitamins-Minerals (MULTIVITAMIN PO) Take by mouth.  . [DISCONTINUED] Omega-3 Fatty Acids (FISH OIL PO) Take by mouth.   No facility-administered encounter medications on file as of 08/18/2015.   ALLERGIES: No Known Allergies VACCINATION STATUS: Immunization History  Administered Date(s) Administered  . DTaP 08/04/2009    HPI  The patient presents today with a medical history as above, and is being seen in consultation for hyperthyroidism requested by Dr. Gar Ponto. - The patient has been  dealing with symptoms of  weight loss , mild tremors, hot flashes. -She explains several weeks ago she did have neck pain in the area of the thyroid. This prompted the commission of thyroid function tests which showed elevated total T4. This was followed by thyroid uptake and scan which was reported as significant for toxic nodule on the left lobe.   The patient has  family history of thyroid dysfunction  in her mother who underwent what appears to be radioactive iodine uptake therapy.. -the patient denies personal history of goiter. Patient is not on any anti-thyroid medications nor on any thyroid hormone supplements. Patient  is willing to proceed with appropriate work up and therapy for thyrotoxicosis.   Review of Systems Constitutional: + weight loss of order pounds overall ( she explains this was intentional weight loss with phentermine), no fatigue, no subjective hyperthermia/hypothermia Eyes: no blurry vision, no xerophthalmia ENT: no sore throat, no nodules palpated in throat, no dysphagia/odynophagia, no hoarseness Cardiovascular: no CP/SOB/palpitations/leg swelling Respiratory: no cough/SOB Gastrointestinal: no N/V/D/C Musculoskeletal: no muscle/joint aches Skin: no rashes Neurological: + tremors Psychiatric: no depression/anxiety  Objective:    BP 152/93 mmHg  Pulse 82  Ht 5\' 4"  (1.626 m)  Wt 140 lb (63.504 kg)  BMI 24.02 kg/m2  SpO2 100%  Wt Readings from Last 3 Encounters:  08/18/15 140 lb (63.504 kg)  12/31/14 146 lb (66.225 kg)  10/02/13 144 lb (65.318 kg)    Physical Exam Constitutional: overweight, in NAD Eyes: PERRLA, EOMI, no exophthalmos ENT: moist mucous membranes, no thyromegaly, no cervical lymphadenopathy Cardiovascular: RRR, No MRG Respiratory: CTA B Gastrointestinal: abdomen soft, NT, ND, BS+ Musculoskeletal: no deformities, strength intact in all 4 Skin: moist, warm, no rashes Neurological: + tremor with outstretched hands, ++ DTR  in all  4  Results for orders placed or performed in visit on 12/31/14  Comprehensive metabolic panel  Result Value Ref Range   Sodium 139 135 - 145 mEq/L   Potassium 3.6 3.5 - 5.3 mEq/L   Chloride 102 96 - 112 mEq/L   CO2 26 19 - 32 mEq/L   Glucose, Bld 119 (H) 70 - 99 mg/dL   BUN 9 6 - 23 mg/dL   Creat 0.75 0.50 - 1.10 mg/dL   Total Bilirubin 0.7 0.2 - 1.2 mg/dL   Alkaline Phosphatase 60 39 - 117 U/L   AST 16 0 - 37 U/L   ALT 12 0 - 35 U/L   Total Protein 7.0 6.0 - 8.3 g/dL   Albumin 4.2 3.5 - 5.2 g/dL   Calcium 9.4 8.4 - 10.5 mg/dL  Hemoglobin, fingerstick  Result Value Ref Range   Hemoglobin, fingerstick 12.5 12.0 - 16.0 g/dL  POCT urinalysis dipstick  Result Value Ref Range   Color, UA yellow    Clarity, UA clear    Glucose, UA neg    Bilirubin, UA ne    Ketones, UA neg    Spec Grav, UA     Blood, UA neg    pH, UA 7.0    Protein, UA neg    Urobilinogen, UA negative    Nitrite, UA neg    Leukocytes, UA Negative Negative    Lab Results  Component Value Date   NA 139 12/31/2014   K 3.6 12/31/2014   CL 102 12/31/2014   CO2 26 12/31/2014   BUN 9 12/31/2014   CREATININE 0.75 12/31/2014    Lab Results  Component Value Date   HGBA1C 5.5 12/31/2014   HGBA1C 5.4 10/02/2013   Lipid Panel     Component Value Date/Time   CHOL 169 10/02/2013 1412   TRIG 76 10/02/2013 1412   HDL 65 10/02/2013 1412   CHOLHDL 2.6 10/02/2013 1412   VLDL 15 10/02/2013 1412   LDLCALC 89 10/02/2013 1412    Assessment & Plan:   1. Toxic thyroid nodule Patient's history and most recent labs are reviewed.  -It appears that she did have thyroiditis with brief elevation in total T4.  -Her thyroid  uptake and scan showed overall low uptake of 6%  with suspicious warm nodule on the left lobe suppressing the rest of the thyroid. -She has some subtle symptoms and signs of thyrotoxicosis, however I will proceed to repeat thyroid function test before deciding to give her ablative therapy. -He will  have thyroid function tests on her way home today. She will return in one week to discuss the options of therapy.   -She does not have clinical goiter hence no need for thyroid ultrasound.  - I advised patient to maintain close follow up with Gar Ponto, MD for primary care needs.  Follow up plan: Return in about 1 week (around 08/25/2015) for overactive thyroid, labs today.  Glade Lloyd, MD Phone: [phone]  Fax: [phone]   08/18/2015, 3:33 PM

## 2015-08-19 ENCOUNTER — Encounter: Payer: Self-pay | Admitting: "Endocrinology

## 2015-08-19 ENCOUNTER — Telehealth: Payer: Self-pay

## 2015-08-19 LAB — TSH: TSH: 0.96 u[IU]/mL (ref 0.350–4.500)

## 2015-08-19 LAB — T3, FREE: T3, Free: 2 pg/mL — ABNORMAL LOW (ref 2.3–4.2)

## 2015-08-19 LAB — T4, FREE: FREE T4: 0.86 ng/dL (ref 0.80–1.80)

## 2015-08-19 LAB — THYROID PEROXIDASE ANTIBODY: THYROID PEROXIDASE ANTIBODY: 2 [IU]/mL (ref <9)

## 2015-08-19 LAB — THYROGLOBULIN ANTIBODY: Thyroglobulin Ab: 2 IU/mL — ABNORMAL HIGH (ref <2)

## 2015-08-19 NOTE — Telephone Encounter (Signed)
Pt left this message today for Dr Dorris Fetch.      Hi, yesterday during my visit, Dr. Dorris Fetch was talking about Thyroiditis that could be caused by a virus. After reading about this, I thought it may be worth mentioning that in October 2016 I was around young children who were showing symptoms of Hand, Foot and Mouth disease (Coxsackie Virus). I don't know if I could have picked up the virus and if it could have affected my thyroid, but I wanted to let you know about this. Thank you

## 2015-08-19 NOTE — Telephone Encounter (Signed)
We will discuss it on her return visit.

## 2015-08-25 ENCOUNTER — Ambulatory Visit (INDEPENDENT_AMBULATORY_CARE_PROVIDER_SITE_OTHER): Payer: BLUE CROSS/BLUE SHIELD | Admitting: "Endocrinology

## 2015-08-25 ENCOUNTER — Encounter: Payer: Self-pay | Admitting: "Endocrinology

## 2015-08-25 VITALS — BP 138/83 | HR 67 | Ht 64.0 in | Wt 141.0 lb

## 2015-08-25 DIAGNOSIS — E0510 Thyrotoxicosis with toxic single thyroid nodule without thyrotoxic crisis or storm: Secondary | ICD-10-CM

## 2015-08-25 NOTE — Progress Notes (Signed)
Subjective:    Patient ID: Mary Bolton, female    DOB: 01-06-1966, PCP Gar Ponto, MD.   Past Medical History  Diagnosis Date  . IUD (intrauterine device) in place 07/12/2008    Expires 07/12/2013  . Anxiety   . Depression   . IUD (intrauterine device) in place 07/14/2013    Exp. 07/14/2018  . TMJ (temporomandibular joint disorder)    Past Surgical History  Procedure Laterality Date  . Cesarean section      x 2   Social History   Social History  . Marital Status: Married    Spouse Name: N/A  . Number of Children: 2  . Years of Education: N/A   Occupational History  . self employed    Social History Main Topics  . Smoking status: Former Research scientist (life sciences)  . Smokeless tobacco: Never Used  . Alcohol Use: No  . Drug Use: No  . Sexual Activity:    Partners: Male    Birth Control/ Protection: IUD     Comment: mirena   Other Topics Concern  . None   Social History Narrative   Outpatient Encounter Prescriptions as of 08/25/2015  Medication Sig  . SUMAtriptan (IMITREX) 100 MG tablet Take 1 tablet by mouth daily as needed.  . topiramate (TOPAMAX) 25 MG tablet Take 25 mg by mouth 2 (two) times daily.   No facility-administered encounter medications on file as of 08/25/2015.   ALLERGIES: No Known Allergies VACCINATION STATUS: Immunization History  Administered Date(s) Administered  . DTaP 08/04/2009    HPI  The patient presents today with a medical history as above. He is here to follow-up with repeat thyroid function test. -She has no new complaints since last visit. - The patient has been dealing with symptoms of  weight loss , mild tremors, hot flashes. -She explains several weeks ago she did have neck pain in the area of the thyroid. This prompted the commission of thyroid function tests which showed elevated total T4. This was followed by thyroid uptake and scan which was reported as significant for toxic nodule on the left lobe.   The patient has  family  history of thyroid dysfunction  in her mother who underwent what appears to be radioactive iodine uptake therapy. -the patient denies personal history of goiter. Patient is not on any anti-thyroid medications nor on any thyroid hormone supplements.  Review of Systems   Constitutional: + weight loss of order pounds overall ( she explains this was intentional weight loss with phentermine), no fatigue, no subjective hyperthermia/hypothermia Eyes: no blurry vision, no xerophthalmia ENT: no sore throat, no nodules palpated in throat, no dysphagia/odynophagia, no hoarseness Cardiovascular: no CP/SOB/palpitations/leg swelling Respiratory: no cough/SOB Gastrointestinal: no N/V/D/C Musculoskeletal: no muscle/joint aches Skin: no rashes Neurological: - tremors Psychiatric: no depression/anxiety  Objective:    BP 138/83 mmHg  Pulse 67  Ht 5\' 4"  (1.626 m)  Wt 141 lb (63.957 kg)  BMI 24.19 kg/m2  SpO2 99%  Wt Readings from Last 3 Encounters:  08/25/15 141 lb (63.957 kg)  08/18/15 140 lb (63.504 kg)  12/31/14 146 lb (66.225 kg)    Physical Exam Constitutional: overweight, in NAD Eyes: PERRLA, EOMI, no exophthalmos ENT: moist mucous membranes, no thyromegaly, no cervical lymphadenopathy Cardiovascular: RRR, No MRG Respiratory: CTA B Gastrointestinal: abdomen soft, NT, ND, BS+ Musculoskeletal: no deformities, strength intact in all 4 Skin: moist, warm, no rashes Neurological: - tremor , + DTR  in all 4  Results for orders placed or performed  in visit on 08/18/15  Thyroid peroxidase antibody  Result Value Ref Range   Thyroperoxidase Ab SerPl-aCnc 2 <9 IU/mL  Thyroglobulin antibody  Result Value Ref Range   Thyroglobulin Ab 2 (H) <2 IU/mL  T4, free  Result Value Ref Range   Free T4 0.86 0.80 - 1.80 ng/dL  TSH  Result Value Ref Range   TSH 0.960 0.350 - 4.500 uIU/mL  T3, free  Result Value Ref Range   T3, Free 2.0 (L) 2.3 - 4.2 pg/mL    Lab Results  Component Value Date   NA  139 12/31/2014   K 3.6 12/31/2014   CL 102 12/31/2014   CO2 26 12/31/2014   BUN 9 12/31/2014   CREATININE 0.75 12/31/2014    Lab Results  Component Value Date   HGBA1C 5.5 12/31/2014   HGBA1C 5.4 10/02/2013   Lipid Panel     Component Value Date/Time   CHOL 169 10/02/2013 1412   TRIG 76 10/02/2013 1412   HDL 65 10/02/2013 1412   CHOLHDL 2.6 10/02/2013 1412   VLDL 15 10/02/2013 1412   LDLCALC 89 10/02/2013 1412    Assessment & Plan:   1. Toxic thyroid nodule Patient's history and most recent labs are reviewed. -Her repeat thyroid function tests are within normal limits, no evidence of hyperthyroidism. -She has positive antithyroglobulin antibodies indicating autoimmune background for future thyroid dysfunction. She would not need any thyroid function and intervention at this point, we will need repeat thyroid function test in 6 month.  -It appears that she did have thyroiditis with brief elevation in total T4.  -Her thyroid  uptake and scan showed overall low uptake of 6%  with suspicious warm nodule on the left lobe suppressing the rest of the thyroid.  -She does not have clinical goiter hence no need for thyroid ultrasound.  - I advised patient to maintain close follow up with Gar Ponto, MD for primary care needs.  Follow up plan: Return in about 6 months (around 02/22/2016) for overactive thyroid.  Glade Lloyd, MD Phone: [phone]  Fax: [phone]   08/25/2015, 2:32 PM

## 2015-08-28 DIAGNOSIS — E0510 Thyrotoxicosis with toxic single thyroid nodule without thyrotoxic crisis or storm: Secondary | ICD-10-CM

## 2015-08-28 HISTORY — DX: Thyrotoxicosis with toxic single thyroid nodule without thyrotoxic crisis or storm: E05.10

## 2016-02-22 ENCOUNTER — Ambulatory Visit: Payer: BLUE CROSS/BLUE SHIELD | Admitting: "Endocrinology

## 2016-03-30 ENCOUNTER — Ambulatory Visit: Payer: BLUE CROSS/BLUE SHIELD | Admitting: Obstetrics & Gynecology

## 2016-05-21 ENCOUNTER — Ambulatory Visit: Payer: BLUE CROSS/BLUE SHIELD | Admitting: Obstetrics & Gynecology

## 2016-10-08 ENCOUNTER — Ambulatory Visit: Payer: BLUE CROSS/BLUE SHIELD | Admitting: Obstetrics & Gynecology

## 2016-12-25 ENCOUNTER — Ambulatory Visit: Payer: BLUE CROSS/BLUE SHIELD | Admitting: Obstetrics & Gynecology

## 2016-12-25 NOTE — Progress Notes (Deleted)
51 y.o. [id number] MarriedCaucasianF here for annual exam.    No LMP recorded. Patient is not currently having periods (Reason: IUD).          Sexually active: {yes no:314532}  The current method of family planning is {contraception:315051}.    Exercising: {yes no:314532}  {types:19826} Smoker:  {YES NO:22349}  Health Maintenance: Pap:  10/02/13 Neg   09/2011 Neg. HR HPV:neg  History of abnormal Pap:  no MMG:  12/31/14 BIRADS1:Neg  Colonoscopy:  *** BMD:   *** TDaP:  2011 Pneumonia vaccine(s):  *** Zostavax:   *** Hep C testing: *** Screening Labs: ***, Hb today: ***, Urine today: ***   reports that she has quit smoking. She has never used smokeless tobacco. She reports that she does not drink alcohol or use drugs.  Past Medical History:  Diagnosis Date  . Anxiety   . Depression   . IUD (intrauterine device) in place 07/12/2008   Expires 07/12/2013  . IUD (intrauterine device) in place 07/14/2013   Exp. 07/14/2018  . TMJ (temporomandibular joint disorder)     Past Surgical History:  Procedure Laterality Date  . CESAREAN SECTION     x 2    Current Outpatient Prescriptions  Medication Sig Dispense Refill  . SUMAtriptan (IMITREX) 100 MG tablet Take 1 tablet by mouth daily as needed.    . topiramate (TOPAMAX) 25 MG tablet Take 25 mg by mouth 2 (two) times daily.     No current facility-administered medications for this visit.     Family History  Problem Relation Age of Onset  . Breast cancer Mother   . COPD Mother   . Diabetes Paternal Grandfather   . Hypertension Paternal Grandfather   . Kidney cancer Paternal Grandfather   . Stroke Paternal Grandfather   . Hypertension Paternal Grandmother   . Hypertension Father     ROS:  Pertinent items are noted in HPI.  Otherwise, a comprehensive ROS was negative.  Exam:   There were no vitals taken for this visit.  Weight change: @WEIGHTCHANGE @ Height:      Ht Readings from Last 3 Encounters:  08/25/15 5\' 4"  (1.626 m)   08/18/15 5\' 4"  (1.626 m)  12/31/14 5\' 4"  (1.626 m)    General appearance: alert, cooperative and appears stated age Head: Normocephalic, without obvious abnormality, atraumatic Neck: no adenopathy, supple, symmetrical, trachea midline and thyroid {EXAM; THYROID:18604} Lungs: clear to auscultation bilaterally Breasts: {Exam; breast:13139::"normal appearance, no masses or tenderness"} Heart: regular rate and rhythm Abdomen: soft, non-tender; bowel sounds normal; no masses,  no organomegaly Extremities: extremities normal, atraumatic, no cyanosis or edema Skin: Skin color, texture, turgor normal. No rashes or lesions Lymph nodes: Cervical, supraclavicular, and axillary nodes normal. No abnormal inguinal nodes palpated Neurologic: Grossly normal   Pelvic: External genitalia:  no lesions              Urethra:  normal appearing urethra with no masses, tenderness or lesions              Bartholins and Skenes: normal                 Vagina: normal appearing vagina with normal color and discharge, no lesions              Cervix: {exam; cervix:14595}              Pap taken: {yes no:314532} Bimanual Exam:  Uterus:  {exam; uterus:12215}  Adnexa: {exam; adnexa:12223}               Rectovaginal: Confirms               Anus:  normal sphincter tone, no lesions  Chaperone was present for exam.  A:  Well Woman with normal exam  P:   {plan; gyn:5269::"mammogram","pap smear","return annually or prn"}

## 2016-12-28 ENCOUNTER — Other Ambulatory Visit (HOSPITAL_COMMUNITY)
Admission: RE | Admit: 2016-12-28 | Discharge: 2016-12-28 | Disposition: A | Discharge status: Home / Self Care | Payer: BLUE CROSS/BLUE SHIELD | Source: Ambulatory Visit | Attending: Obstetrics & Gynecology | Admitting: Obstetrics & Gynecology

## 2016-12-28 ENCOUNTER — Encounter: Payer: Self-pay | Admitting: Obstetrics & Gynecology

## 2016-12-28 ENCOUNTER — Ambulatory Visit (INDEPENDENT_AMBULATORY_CARE_PROVIDER_SITE_OTHER): Payer: BLUE CROSS/BLUE SHIELD | Admitting: Obstetrics & Gynecology

## 2016-12-28 VITALS — BP 118/78 | HR 60 | Resp 12 | Ht 63.5 in | Wt 148.0 lb

## 2016-12-28 DIAGNOSIS — F32A Depression, unspecified: Secondary | ICD-10-CM | POA: Insufficient documentation

## 2016-12-28 DIAGNOSIS — F329 Major depressive disorder, single episode, unspecified: Secondary | ICD-10-CM | POA: Insufficient documentation

## 2016-12-28 DIAGNOSIS — Z01419 Encounter for gynecological examination (general) (routine) without abnormal findings: Secondary | ICD-10-CM

## 2016-12-28 DIAGNOSIS — Z124 Encounter for screening for malignant neoplasm of cervix: Secondary | ICD-10-CM | POA: Diagnosis not present

## 2016-12-28 DIAGNOSIS — Z975 Presence of (intrauterine) contraceptive device: Secondary | ICD-10-CM

## 2016-12-28 DIAGNOSIS — G43909 Migraine, unspecified, not intractable, without status migrainosus: Secondary | ICD-10-CM | POA: Insufficient documentation

## 2016-12-28 DIAGNOSIS — G43709 Chronic migraine without aura, not intractable, without status migrainosus: Secondary | ICD-10-CM | POA: Diagnosis not present

## 2016-12-28 NOTE — Progress Notes (Signed)
51 y.o. [id number] MarriedCaucasianF here for annual exam.  Doing well.  Had issues with a toxic thyroid nodule.  Saw endocrinologist, Dr. Dorris Fetch.  Hashimoto's thyroiditis was diagnosed.  Her thyroid levels normalized.  She has undergone repeat thyroid test which is normal.  Denies vaginal bleeding.  Has Mirena IUD.  Placed 07/14/2013.  PCP:  Dr. Coralyn Mark.    No LMP recorded. Patient is not currently having periods (Reason: IUD).          Sexually active: Yes.    The current method of family planning is IUD. Mirena inserted 07/14/13     Exercising: No.  The patient does not participate in regular exercise at present. Smoker:  no  Health Maintenance: Pap:  10/02/13 Neg   09/2011 Neg. HR HPV:neg  History of abnormal Pap:  no MMG:  12/31/14 BIRADS1:neg.  Colonoscopy:  Never BMD:   Never TDaP:  2011 Pneumonia vaccine(s):  No Zostavax:   No Hep C testing: N/A Screening Labs: done with Dr. Quillian Quince   reports that she has quit smoking. She has never used smokeless tobacco. She reports that she does not drink alcohol or use drugs.  Past Medical History:  Diagnosis Date  . Anxiety   . Depression   . IUD (intrauterine device) in place 07/12/2008   Expires 07/12/2013  . IUD (intrauterine device) in place 07/14/2013   Exp. 07/14/2018  . TMJ (temporomandibular joint disorder)     Past Surgical History:  Procedure Laterality Date  . CESAREAN SECTION     x 2    Current Outpatient Prescriptions  Medication Sig Dispense Refill  . SUMAtriptan (IMITREX) 100 MG tablet Take 1 tablet by mouth daily as needed.    . topiramate (TOPAMAX) 25 MG tablet Take 25 mg by mouth 2 (two) times daily.     No current facility-administered medications for this visit.     Family History  Problem Relation Age of Onset  . Diabetes Paternal Grandfather   . Hypertension Paternal Grandfather   . Kidney cancer Paternal Grandfather   . Stroke Paternal Grandfather   . Breast cancer Mother   . COPD Mother   .  Hypertension Paternal Grandmother   . Hypertension Father     ROS:  Pertinent items are noted in HPI.  Otherwise, a comprehensive ROS was negative.  Exam:   BP 118/78 (BP Location: Right Arm, Patient Position: Sitting, Cuff Size: Normal)   Pulse 60   Resp 12   Ht 5' 3.5" (1.613 m)   Wt 148 lb (67.1 kg)   BMI 25.81 kg/m   Weight change: +2#  Height: 5' 3.5" (161.3 cm)  Ht Readings from Last 3 Encounters:  12/28/16 5' 3.5" (1.613 m)  08/25/15 5\' 4"  (1.626 m)  08/18/15 5\' 4"  (1.626 m)    General appearance: alert, cooperative and appears stated age Head: Normocephalic, without obvious abnormality, atraumatic Neck: no adenopathy, supple, symmetrical, trachea midline and thyroid normal to inspection and palpation Lungs: clear to auscultation bilaterally Breasts: normal appearance, no masses or tenderness Heart: regular rate and rhythm Abdomen: soft, non-tender; bowel sounds normal; no masses,  no organomegaly Extremities: extremities normal, atraumatic, no cyanosis or edema Skin: Skin color, texture, turgor normal. No rashes or lesions Lymph nodes: Cervical, supraclavicular, and axillary nodes normal. No abnormal inguinal nodes palpated Neurologic: Grossly normal   Pelvic: External genitalia:  no lesions              Urethra:  normal appearing urethra with no masses, tenderness or  lesions              Bartholins and Skenes: normal                 Vagina: normal appearing vagina with normal color and discharge, no lesions              Cervix: no lesions , IUD string noted              Pap taken: Yes.   Bimanual Exam:  Uterus:  normal size, contour, position, consistency, mobility, non-tender              Adnexa: normal adnexa and no mass, fullness, tenderness               Rectovaginal: Confirms               Anus:  normal sphincter tone, no lesions  Chaperone was present for exam.  A:  Well Woman with normal exam Amenorrhea with Mirena IUD, pleased 12/14. H/O  anxiety/depression H/o Vit D deficiency Mildly elevated lipids H/O toxic thyroid nodule, now with normal thyroid studies  P:   Mammogram due.  Pt aware and states she will schedule pap smear and HR HPV obtained today Lab work UTD with PCP return annually or prn

## 2017-01-01 LAB — CYTOLOGY - PAP
DIAGNOSIS: NEGATIVE
HPV: NOT DETECTED

## 2018-03-14 ENCOUNTER — Encounter: Payer: Self-pay | Admitting: Obstetrics & Gynecology

## 2018-03-14 ENCOUNTER — Other Ambulatory Visit: Payer: Self-pay

## 2018-03-14 ENCOUNTER — Ambulatory Visit: Payer: BLUE CROSS/BLUE SHIELD | Admitting: Obstetrics & Gynecology

## 2018-03-14 VITALS — BP 130/80 | HR 88 | Resp 16 | Ht 63.75 in | Wt 150.4 lb

## 2018-03-14 DIAGNOSIS — Z01419 Encounter for gynecological examination (general) (routine) without abnormal findings: Secondary | ICD-10-CM

## 2018-03-14 DIAGNOSIS — Z0000 Encounter for general adult medical examination without abnormal findings: Secondary | ICD-10-CM | POA: Diagnosis not present

## 2018-03-14 DIAGNOSIS — N951 Menopausal and female climacteric states: Secondary | ICD-10-CM | POA: Diagnosis not present

## 2018-03-14 DIAGNOSIS — E063 Autoimmune thyroiditis: Secondary | ICD-10-CM | POA: Diagnosis not present

## 2018-03-14 NOTE — Progress Notes (Addendum)
52 y.o. [id number] MarriedCaucasianF here for annual exam.  Oldest just started at [employer].  She is living in an honors dorm.    Does not cycle with Mirena IUD.  Feels she is "not herself".  Thinks her daughter leaving has something to do with this.  Experiencing decreased libido.  Doesn't want to be hugged or kissed.  Feels this has been going on for 6-29m.  Feels last episode was "forced" and so they ended up in a significant argument  Having emotional swings--feeling weepy or angry.  Having night sweats and some trouble sleeping.  Having some increased anxiety with her daughter going to college.    Endocrinologist:  Dr. Dorris Fetch.  Has not has follow-up.  Has hx of Hashimoto's thyroiditis.    No LMP recorded. (Menstrual status: IUD).          Sexually active: Yes.    The current method of family planning is IUD. Mirena placed 07/14/13    Exercising: Yes.    walking, swimming, videos  Smoker:  no  Health Maintenance: Pap:  12/28/16 Neg. HR HPV:neg   10/02/13 Neg  History of abnormal Pap:  no MMG:  12/31/14 BIRADS1:Neg.  Pt is aware of this being overdue.   Colonoscopy:  Never BMD:   Never TDaP:  2011 Screening Labs: PCP   reports that she has quit smoking. She has never used smokeless tobacco. She reports that she does not drink alcohol or use drugs.  Past Medical History:  Diagnosis Date  . Anxiety   . Depression   . IUD (intrauterine device) in place 07/12/2008   Expires 07/12/2013  . IUD (intrauterine device) in place 07/14/2013   Exp. 07/14/2018  . TMJ (temporomandibular joint disorder)     Past Surgical History:  Procedure Laterality Date  . CESAREAN SECTION     x 2    Current Outpatient Medications  Medication Sig Dispense Refill  . SUMAtriptan (IMITREX) 100 MG tablet Take 1 tablet by mouth daily as needed.    . topiramate (TOPAMAX) 25 MG tablet Take 25 mg by mouth 2 (two) times daily.     No current facility-administered medications for this visit.     Family  History  Problem Relation Age of Onset  . Diabetes Paternal Grandfather   . Hypertension Paternal Grandfather   . Kidney cancer Paternal Grandfather   . Stroke Paternal Grandfather   . Breast cancer Mother   . COPD Mother   . Hypertension Paternal Grandmother   . Hypertension Father     Review of Systems  Constitutional:       Weight gain   Neurological: Positive for headaches.  All other systems reviewed and are negative.   Exam:   BP 130/80 (BP Location: Right Arm, Patient Position: Sitting, Cuff Size: Normal)   Pulse 88   Resp 16   Ht 5' 3.75" (1.619 m)   Wt 150 lb 6.4 oz (68.2 kg)   BMI 26.02 kg/m   Height: 5' 3.75" (161.9 cm)  Ht Readings from Last 3 Encounters:  03/14/18 5' 3.75" (1.619 m)  12/28/16 5' 3.5" (1.613 m)  08/25/15 5\' 4"  (1.626 m)    General appearance: alert, cooperative and appears stated age Head: Normocephalic, without obvious abnormality, atraumatic Neck: no adenopathy, supple, symmetrical, trachea midline and thyroid normal to inspection and palpation Lungs: clear to auscultation bilaterally Breasts: normal appearance, no masses or tenderness Heart: regular rate and rhythm Abdomen: soft, non-tender; bowel sounds normal; no masses,  no organomegaly Extremities:  extremities normal, atraumatic, no cyanosis or edema Skin: Skin color, texture, turgor normal. No rashes or lesions Lymph nodes: Cervical, supraclavicular, and axillary nodes normal. No abnormal inguinal nodes palpated Neurologic: Grossly normal   Pelvic: External genitalia:  no lesions              Urethra:  normal appearing urethra with no masses, tenderness or lesions              Bartholins and Skenes: normal                 Vagina: normal appearing vagina with normal color and discharge, no lesions              Cervix: no lesions              Pap taken: No. Bimanual Exam:  Uterus:  normal size, contour, position, consistency, mobility, non-tender              Adnexa: normal  adnexa and no mass, fullness, tenderness               Rectovaginal: Confirms               Anus:  normal sphincter tone, no lesions  Pt declines chaperone for any portion of the exam.  A:  Well Woman with normal exam H/O Amenorrhea with Mirena IUD, placed 12/14 H/O anxiety and depression Vit D deficiency H/O mildly elevated LDLs H/O toxic thyroid nodule, did not follow up with endocrinologist.  Desires to see someone else. Perimenopausal symptoms  P:   Mammogram guidelines reviewed.  Clearly aware I recommend this.   pap smear with neg HR HPV 2018.  Not indicated today. Colonoscopy screening reviewed.  Will consider doing Cologuard.   TSh and panel obtained today Screening blood work obtained Will also test [facility] and estradiol.  Results and recommendations will be called to pt.  return annually or prn

## 2018-03-15 LAB — COMPREHENSIVE METABOLIC PANEL
ALBUMIN: 4.7 g/dL (ref 3.5–5.5)
ALK PHOS: 84 IU/L (ref 39–117)
ALT: 13 IU/L (ref 0–32)
AST: 20 IU/L (ref 0–40)
Albumin/Globulin Ratio: 1.7 (ref 1.2–2.2)
BUN / CREAT RATIO: 18 (ref 9–23)
BUN: 15 mg/dL (ref 6–24)
Bilirubin Total: 0.4 mg/dL (ref 0.0–1.2)
CO2: 22 mmol/L (ref 20–29)
CREATININE: 0.83 mg/dL (ref 0.57–1.00)
Calcium: 9.9 mg/dL (ref 8.7–10.2)
Chloride: 101 mmol/L (ref 96–106)
GFR, EST AFRICAN AMERICAN: 96 mL/min/{1.73_m2} (ref >59)
GFR, EST NON AFRICAN AMERICAN: 83 mL/min/{1.73_m2} (ref >59)
GLOBULIN, TOTAL: 2.8 g/dL (ref 1.5–4.5)
GLUCOSE: 105 mg/dL — AB (ref 65–99)
Potassium: 3.4 mmol/L — ABNORMAL LOW (ref 3.5–5.2)
SODIUM: 140 mmol/L (ref 134–144)
TOTAL PROTEIN: 7.5 g/dL (ref 6.0–8.5)

## 2018-03-15 LAB — LIPID PANEL
CHOLESTEROL TOTAL: 205 mg/dL — AB (ref 100–199)
Chol/HDL Ratio: 2.6 ratio (ref 0.0–4.4)
HDL: 79 mg/dL (ref >39)
LDL CALC: 114 mg/dL — AB (ref 0–99)
TRIGLYCERIDES: 61 mg/dL (ref 0–149)
VLDL CHOLESTEROL CAL: 12 mg/dL (ref 5–40)

## 2018-03-15 LAB — VITAMIN D 25 HYDROXY (VIT D DEFICIENCY, FRACTURES): Vit D, 25-Hydroxy: 37.1 ng/mL (ref 30.0–100.0)

## 2018-03-15 LAB — CBC
HEMATOCRIT: 39.5 % (ref 34.0–46.6)
HEMOGLOBIN: 13.2 g/dL (ref 11.1–15.9)
MCH: 29.5 pg (ref 26.6–33.0)
MCHC: 33.4 g/dL (ref 31.5–35.7)
MCV: 88 fL (ref 79–97)
Platelets: 303 10*3/uL (ref 150–450)
RBC: 4.47 x10E6/uL (ref 3.77–5.28)
RDW: 12.4 % (ref 12.3–15.4)
WBC: 4.8 10*3/uL (ref 3.4–10.8)

## 2018-03-15 LAB — THYROID PANEL WITH TSH
FREE THYROXINE INDEX: 2.3 (ref 1.2–4.9)
T3 Uptake Ratio: 26 % (ref 24–39)
T4, Total: 8.8 ug/dL (ref 4.5–12.0)
TSH: 1.63 u[IU]/mL (ref 0.450–4.500)

## 2018-03-15 LAB — ESTRADIOL: Estradiol: 63.8 pg/mL

## 2018-03-15 LAB — FOLLICLE STIMULATING HORMONE: FSH: 79.1 m[IU]/mL

## 2018-03-19 ENCOUNTER — Telehealth: Payer: Self-pay | Admitting: Obstetrics & Gynecology

## 2018-03-19 NOTE — Telephone Encounter (Signed)
Patient requesting lab work from 8/30.

## 2018-03-19 NOTE — Telephone Encounter (Signed)
I signed these off already but am not sure if I routed them to triage.  I'm sorry if I did not.

## 2018-03-19 NOTE — Telephone Encounter (Signed)
Spoke with patient. Advised Dr. Sabra Heck needs to review labs, she is out of the office today, will return 9/5. Our office will return call once labs are reviewed. Patient is agreeable to plan.   Dr. Sabra Heck -please review labs dated 03/14/18.

## 2018-03-20 MED ORDER — ESTRADIOL 0.5 MG PO TABS: 0.5000 mg | ORAL_TABLET | Freq: Every day | ORAL | 0 refills | Status: DC

## 2018-03-20 NOTE — Telephone Encounter (Signed)
-----   Message from Megan Salon, MD sent at 03/19/2018  2:05 PM EDT ----- Please let pt now her [facility] is in menopausal range.  She is having decreased libido issues and mood changes as well.  I would suggest starting Estradiol 0.5mg  daily and rechecking in one month to see how much this helps.  She will not need an exam at that time (she really dislikes them).  She has an IUD that is due to be removed in December so does not need progesterone added at thsi time either.  Thyroid, CBC, and Vit D levels were normal.  Glucose was 105 but I do not believe she was fasting for the test so no additional testing is needed for this at this time.  Lastly, her total cholesterol was 205 and LDLs were 114.  It's ok to watch this as these are only mildly increased.

## 2018-03-20 NOTE — Telephone Encounter (Signed)
Spoke with patient, advised as seen below per Dr. Sabra Heck. Rx for Estradiol 0.5mg  po #30/0RF to verified pharmacy. OV scheduled for 10/4 at 2pm with Dr. Sabra Heck. Patient verbalizes understanding and is agreeable. Encounter closed.

## 2018-04-01 ENCOUNTER — Telehealth: Payer: Self-pay | Admitting: Obstetrics & Gynecology

## 2018-04-01 NOTE — Telephone Encounter (Signed)
Spoke with patient. Started estradiol 0.5mg  9/5. Reports bloating in waist, waist has increased at least 1 inch, breast are bigger, tingling sensation in finger and toes, swelling in feet, increased thirst and dry lips. Breast tenderness initially, has now resolved. Stopped med for 3 days, tingling sensation resolved, restarted medication on 9/16, symptoms returned.   Patient states she has an increased risk of breast cancer in her family, requesting alternative medication or recommendations to estrogen. Patient states she started medication for increased hot flashes, mood changes and decreased libido.    Advised will review with Dr. Sabra Heck and return call with recommendations.   Routing to Dr. Sabra Heck to advise.

## 2018-04-01 NOTE — Telephone Encounter (Signed)
Would recommend trial of paxil 10mg  daily--non hormonal and is FDA approved for prevention of hot flashes.  Will also help mood.  I would recommend follow up 4 week OV.

## 2018-04-01 NOTE — Telephone Encounter (Signed)
Patient is having some side effects from the estradiol she recently started taking.

## 2018-04-02 MED ORDER — PAROXETINE HCL 10 MG PO TABS: 10.0000 mg | ORAL_TABLET | Freq: Every day | ORAL | 0 refills | Status: DC

## 2018-04-02 NOTE — Telephone Encounter (Signed)
Encounter closed

## 2018-04-02 NOTE — Telephone Encounter (Signed)
Ok ot monitor as she started HRT.  This should stop as she's stopped the estrogen at this time.  Thanks.  Ok to close encounter.

## 2018-04-02 NOTE — Telephone Encounter (Signed)
Spoke with patient, advised as seen below per Dr. Sabra Heck. Patient would like to try paxil, Rx to verified pharmacy. OV scheduled for 10/18 at 3:45pm with Dr. Sabra Heck.   Patient stopped estradiol on 9/17. Reports spotting on 9/19, IUD in place, expires 07/14/18. 03/14/18 [place] menopause range. Denies pain. Advised patient to continue to monitor, if bleeding does not resolve, becomes heavier or new symptoms develop, return call to office. Advised I will review with Dr. Sabra Heck and return call with any additional recommendations.   Dr. Sabra Heck -please review.

## 2018-04-03 ENCOUNTER — Telehealth: Payer: Self-pay | Admitting: Obstetrics & Gynecology

## 2018-04-03 DIAGNOSIS — Z30432 Encounter for removal of intrauterine contraceptive device: Secondary | ICD-10-CM

## 2018-04-03 NOTE — Telephone Encounter (Signed)
Spoke with patient. Patient states she has decide not to start paxil because of potential side effects. Offered OV with Dr. Sabra Heck to discuss alternative options, patient declined. OV for 05/02/18 med f/u cancelled.   Patient is due for IUD removal 06/2018, scheduled for 06/23/18 at 9:30am with Dr. Sabra Heck. Order placed for precert. Advised patient to return call to office if any additional questions/concerns. Patient thankful for assistance.  Routing to provider for final review. Patient is agreeable to disposition. Will close encounter.  Cc: Lerry Liner

## 2018-04-03 NOTE — Telephone Encounter (Signed)
Patient says she does not want to take the new medication she was prescribed because of side effects.

## 2018-04-08 ENCOUNTER — Other Ambulatory Visit: Payer: Self-pay | Admitting: Obstetrics & Gynecology

## 2018-04-08 DIAGNOSIS — Z1231 Encounter for screening mammogram for malignant neoplasm of breast: Secondary | ICD-10-CM

## 2018-04-18 ENCOUNTER — Ambulatory Visit: Payer: Self-pay | Admitting: Obstetrics & Gynecology

## 2018-04-18 ENCOUNTER — Ambulatory Visit
Admission: RE | Admit: 2018-04-18 | Discharge: 2018-04-18 | Disposition: A | Discharge status: Home / Self Care | Payer: BLUE CROSS/BLUE SHIELD | Source: Ambulatory Visit

## 2018-04-18 DIAGNOSIS — Z1231 Encounter for screening mammogram for malignant neoplasm of breast: Secondary | ICD-10-CM

## 2018-05-02 ENCOUNTER — Ambulatory Visit: Payer: Self-pay | Admitting: Obstetrics & Gynecology

## 2018-06-15 HISTORY — PX: IUD REMOVAL: SHX5392

## 2018-06-23 ENCOUNTER — Encounter: Payer: Self-pay | Admitting: Obstetrics & Gynecology

## 2018-06-23 ENCOUNTER — Ambulatory Visit (INDEPENDENT_AMBULATORY_CARE_PROVIDER_SITE_OTHER): Payer: BLUE CROSS/BLUE SHIELD | Admitting: Obstetrics & Gynecology

## 2018-06-23 VITALS — BP 120/88 | HR 100 | Resp 14 | Ht 63.75 in | Wt 154.0 lb

## 2018-06-23 DIAGNOSIS — N951 Menopausal and female climacteric states: Secondary | ICD-10-CM | POA: Diagnosis not present

## 2018-06-23 DIAGNOSIS — Z30432 Encounter for removal of intrauterine contraceptive device: Secondary | ICD-10-CM | POA: Diagnosis not present

## 2018-06-23 MED ORDER — FLUOXETINE HCL 10 MG PO TABS: 10.0000 mg | ORAL_TABLET | Freq: Every day | ORAL | 1 refills | Status: DC

## 2018-06-23 NOTE — Progress Notes (Signed)
52 y.o. [id number] Married Caucasian female presents for removal of Mirena IUD.  Gerster was 80 on 03/14/18.  IUD is due for removal now.  She has not had any vaginal bleeding in several years.    She is having menopausal symptoms.  She is having issues with weight gain as well as mood issues.  She does not want to be on any hormonal medical.  The cancer risk with this scares her.  Other options discussed including SSRI.  Risks, benefits, and side effects discussed.    Daughter is doing well.  She will be home mid next week.  She is pleased that daughter will be home.    She is aware she does not need any other contraception at this point.    Having some hip issues.  Wonders if she has arthritis.  Would like to try some OTC options.    LMP:  No LMP recorded. (Menstrual status: IUD).  Patient Active Problem List   Diagnosis Date Noted  . Depression 12/28/2016  . Migraines 12/28/2016  . 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 IUD (intrauterine device) in place 07/14/2013   Past Medical History:  Diagnosis Date  . Anxiety   . Depression   . IUD (intrauterine device) in place 07/12/2008   Expires 07/12/2013  . IUD (intrauterine device) in place 07/14/2013   Exp. 07/14/2018  . TMJ (temporomandibular joint disorder)    Current Outpatient Medications on File Prior to Visit  Medication Sig Dispense Refill  . Multiple Vitamin (MULTIVITAMIN) tablet Take 1 tablet by mouth daily.    . Omega-3 Fatty Acids (FISH OIL) 1000 MG CAPS Take by mouth daily.    . SUMAtriptan (IMITREX) 100 MG tablet Take 1 tablet by mouth daily as needed.    . topiramate (TOPAMAX) 25 MG tablet Take 25 mg by mouth 2 (two) times daily.     No current facility-administered medications on file prior to visit.    Patient has no known allergies.  Review of Systems  All other systems reviewed and are negative.    Vitals:   06/23/18 0934  BP: 120/88  Pulse: 100  Resp: 14  Weight: 154 lb (69.9 kg)  Height: 5' 3.75" (1.619 m)     Gen:  WNWF healthy female NAD Abdomen: soft, non-tender Groin:  no inguinal nodes palpated  Pelvic exam: Vulva:  normal female genitalia Vagina:  normal vagina Cervix:  Non-tender, Negative CMT, no lesions or redness. Uterus:  normal shape, position and consistency   Procedure:  Speculum reinserted.  Cervix visualized and cleansed with Betadine x 3.  IUD string noted and grasped with ringed forcep.  With one pull, IUD removed easily.  Pt has some mild cramping but tolerated this well.  Pt tolerated the procedure well.  All instruments removed from vagina.  A: Removal of Mirena IUD Menopausal symptoms  P:  Trial of prozac 10mg  daily to pharmacy.  #30/1RF.  Pt to give update in 2-3 weeks Follow up for AEX

## 2018-08-25 ENCOUNTER — Encounter: Payer: Self-pay | Admitting: Obstetrics & Gynecology

## 2018-08-28 LAB — COLOGUARD: Cologuard: NEGATIVE

## 2018-10-13 ENCOUNTER — Telehealth: Payer: Self-pay | Admitting: Obstetrics & Gynecology

## 2018-10-13 NOTE — Telephone Encounter (Signed)
Patient is having spotting and pms symptoms she would like to discuss with a nurse.

## 2018-10-13 NOTE — Telephone Encounter (Signed)
Spoke with patient. Started spotting on 3/29, describes as light, only notices when wiping. Breast are tender. 03/14/18 FSH 79.1, Mirena IUD removed 06/2019. Never had menses with IUD. Denies pelvic pain, fever/chills, N/V, urinary symptoms, cough SOB. Reports hot flashes have improved, mood changes have increased.   Recommended OV for further evaluation, patient declines OV with covering providers, only wants to see Dr. Sabra Heck. OV scheduled for 10/22/18 at 10am with Dr. Sabra Heck. Advised patient to continue to monitor bleeding, if new symptoms develop or bleeding becomes heavy, return call to office. Covering provider will review, our office will return call if any additional recommendations.   Routing to covering provider for final review. Patient is agreeable to disposition. Will close encounter.  Cc: Dr. Sabra Heck

## 2018-10-14 ENCOUNTER — Telehealth: Payer: Self-pay | Admitting: Obstetrics & Gynecology

## 2018-10-14 NOTE — Telephone Encounter (Signed)
Please let the patient know that Dr Sabra Heck is going to be out of the office for at least the next 3 weeks, possibly longer. See if she is willing to see one of the other providers. If not, put her on Dr Sanjuan Dame schedule a month out and we will reschedule her if needed.

## 2018-10-14 NOTE — Telephone Encounter (Signed)
Call to patient to reschedule upcoming appointment. Appointment rescheduled from 4/8 with Dr Sabra Heck to 4/7 with Dr Talbert Nan.   Patient advised she received message in MyChart that states she is past due for Cologuard but states she completed this and requests results be called to her. Please advise.

## 2018-10-15 NOTE — Telephone Encounter (Signed)
Spoke with Glass blower/designer testing completed and negative. Results to be faxed to the office. Patient notified of negative results.  Routing to provider and will close encounter.

## 2018-10-21 ENCOUNTER — Encounter: Payer: Self-pay | Admitting: Obstetrics and Gynecology

## 2018-10-21 ENCOUNTER — Ambulatory Visit: Payer: BLUE CROSS/BLUE SHIELD | Admitting: Obstetrics and Gynecology

## 2018-10-21 ENCOUNTER — Other Ambulatory Visit: Payer: Self-pay

## 2018-10-21 VITALS — BP 120/80 | HR 70 | Temp 97.5°F | Resp 16 | Wt 152.0 lb

## 2018-10-21 DIAGNOSIS — N939 Abnormal uterine and vaginal bleeding, unspecified: Secondary | ICD-10-CM

## 2018-10-21 DIAGNOSIS — N941 Unspecified dyspareunia: Secondary | ICD-10-CM

## 2018-10-21 DIAGNOSIS — N952 Postmenopausal atrophic vaginitis: Secondary | ICD-10-CM

## 2018-10-21 DIAGNOSIS — N951 Menopausal and female climacteric states: Secondary | ICD-10-CM

## 2018-10-21 LAB — POCT URINE PREGNANCY: Preg Test, Ur: NEGATIVE

## 2018-10-21 MED ORDER — MEDROXYPROGESTERONE ACETATE 5 MG PO TABS: ORAL_TABLET | ORAL | 1 refills | Status: DC

## 2018-10-21 MED ORDER — ESTRADIOL 10 MCG VA TABS: 1.0000 | ORAL_TABLET | VAGINAL | 0 refills | Status: DC

## 2018-10-21 NOTE — Progress Notes (Signed)
GYNECOLOGY  VISIT   HPI: 53 y.o.   Married White or Caucasian Not Hispanic or Latino  female   [phone] with Patient's last menstrual period was 09/28/2012.   here for  Post menopausal bleeding. She just had her mirena IUD removed in 12/19.  Her [place] in 8/19 was 79.1, estradiol 63.8 (premenopausal range). She bleed 3/29 x 5 days. She had breast soreness prior to the bleeding, felt premenstrual. The bleeding was very light.  She gets warm but denies hot flashes. She had some night sweats last summer, not currently. Not currently sexually active secondary to pain (deep and entry). No libido. Last active in 2/20. The pain has been going on for at least a year. It feels like her vagina is sore, burns even with lubricant and feels like he is hitting something in side. Position change doesn't help.  No vaginal c/o if not trying to be sexually active. No itching, burning, irritation or discomfort.   GYNECOLOGIC HISTORY: Patient's last menstrual period was 09/28/2012. Contraception:post menopausal Menopausal hormone therapy: none upt-neg        OB History    Gravida  2   Para  2   Term  2   Preterm      AB      Living  2     SAB      TAB      Ectopic      Multiple      Live Births                 Patient Active Problem List   Diagnosis Date Noted  . Migraines 12/28/2016  . 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 Anxiety   . Depression   . IUD (intrauterine device) in place 07/12/2008   Expires 07/12/2013  . IUD (intrauterine device) in place 07/14/2013   Exp. 07/14/2018  . TMJ (temporomandibular joint disorder)     Past Surgical History:  Procedure Laterality Date  . CESAREAN SECTION     x 2    Current Outpatient Medications  Medication Sig Dispense Refill  . Multiple Vitamin (MULTIVITAMIN) tablet Take 1 tablet by mouth daily.    . SUMAtriptan (IMITREX) 100 MG tablet Take 1 tablet by mouth daily as needed.    .  topiramate (TOPAMAX) 25 MG tablet Take 25 mg by mouth 2 (two) times daily.     No current facility-administered medications for this visit.      ALLERGIES: Patient has no known allergies.  Family History  Problem Relation Age of Onset  . Diabetes Paternal Grandfather   . Hypertension Paternal Grandfather   . Kidney cancer Paternal Grandfather   . Stroke Paternal Grandfather   . Breast cancer Mother   . COPD Mother   . Hypertension Paternal Grandmother   . Hypertension Father     Social History   Socioeconomic History  . Marital status: Married    Spouse name: Not on file  . Number of children: 2  . Years of education: Not on file  . Highest education level: Not on file  Occupational History  . Occupation: self employed    Employer: [employer]  Social Needs  . Financial resource strain: Not on file  . Food insecurity:    Worry: Not on file    Inability: Not on file  . Transportation needs:    Medical: Not on file    Non-medical: Not on file  Tobacco Use  . Smoking status: Former Research scientist (life sciences)  . Smokeless tobacco: Never Used  Substance and Sexual Activity  . Alcohol use: No  . Drug use: No  . Sexual activity: Yes    Partners: Male    Birth control/protection: Post-menopausal    Comment: occ sexually active  Lifestyle  . Physical activity:    Days per week: Not on file    Minutes per session: Not on file  . Stress: Not on file  Relationships  . Social connections:    Talks on phone: Not on file    Gets together: Not on file    Attends religious service: Not on file    Active member of club or organization: Not on file    Attends meetings of clubs or organizations: Not on file    Relationship status: Not on file  . Intimate partner violence:    Fear of current or ex partner: Not on file    Emotionally abused: Not on file    Physically abused: Not on file    Forced sexual activity: Not on file  Other Topics Concern  . Not on file  Social History  Narrative  . Not on file    Review of Systems  Constitutional: Negative.   HENT: Negative.   Eyes: Negative.   Respiratory: Negative.   Cardiovascular: Negative.   Gastrointestinal: Negative.   Genitourinary:       Spotting from Sunday to thursday  Musculoskeletal: Negative.   Skin: Negative.        Breast tenderness  Neurological: Negative.   Endo/Heme/Allergies: Negative.   Psychiatric/Behavioral: Negative.     PHYSICAL EXAMINATION:    BP 120/80   Pulse 70   Temp (!) 97.5 F (36.4 C) (Oral)   Resp 16   Wt 152 lb (68.9 kg)   LMP 09/28/2012 Comment: some spotting 10-12-2018  BMI 26.30 kg/m     General appearance: alert, cooperative and appears stated age Neck: no adenopathy, supple, symmetrical, trachea midline and thyroid normal to inspection and palpation Abdomen: soft, non-tender; non distended, no masses,  no organomegaly  Pelvic: External genitalia:  no lesions, no tenderness at the introitus               Urethra:  normal appearing urethra with no masses, tenderness or lesions              Bartholins and Skenes: normal                 Vagina: normal appearing vagina with normal color and discharge, no lesions              Cervix: no lesions              Bimanual Exam:  Uterus:  normal size, contour, position, consistency, mobility, non-tender and anteverted              Adnexa: no mass, fullness, tenderness              Pelvic floor: not tender  Chaperone was present for exam.  ASSESSMENT Perimenopausal bleeding after removal of IUD, patient felt premenstrual prior to the bleeding One year h/o dyspareunia entry and deep. Mild vaginal atrophy on exam, otherwise normal exam.     PLAN Treat with cyclic provera Return for gyn ultrasound Start vaginal estrogen, she should use lubrication with intercourse and she should control the rate and depth of penetration with intercourse.  UPT negative FSH, estradiol   An After Visit Summary  was printed and given to  the patient.  ~25 minutes face to face time of which over 50% was spent in counseling.

## 2018-10-21 NOTE — Patient Instructions (Signed)

## 2018-10-22 ENCOUNTER — Other Ambulatory Visit: Payer: Self-pay

## 2018-10-22 ENCOUNTER — Ambulatory Visit: Payer: Self-pay | Admitting: Obstetrics & Gynecology

## 2018-10-22 LAB — FOLLICLE STIMULATING HORMONE: FSH: 101.7 m[IU]/mL

## 2018-10-22 LAB — ESTRADIOL: Estradiol: 13.7 pg/mL

## 2018-10-23 ENCOUNTER — Ambulatory Visit: Payer: Self-pay | Admitting: Obstetrics and Gynecology

## 2018-10-23 ENCOUNTER — Ambulatory Visit (INDEPENDENT_AMBULATORY_CARE_PROVIDER_SITE_OTHER): Payer: BLUE CROSS/BLUE SHIELD | Admitting: Obstetrics and Gynecology

## 2018-10-23 ENCOUNTER — Encounter: Payer: Self-pay | Admitting: Obstetrics and Gynecology

## 2018-10-23 ENCOUNTER — Other Ambulatory Visit: Payer: Self-pay | Admitting: Obstetrics and Gynecology

## 2018-10-23 ENCOUNTER — Other Ambulatory Visit: Payer: BLUE CROSS/BLUE SHIELD

## 2018-10-23 ENCOUNTER — Ambulatory Visit (INDEPENDENT_AMBULATORY_CARE_PROVIDER_SITE_OTHER): Payer: BLUE CROSS/BLUE SHIELD

## 2018-10-23 VITALS — BP 142/82 | HR 64 | Temp 97.6°F | Ht 63.75 in | Wt 153.0 lb

## 2018-10-23 DIAGNOSIS — R635 Abnormal weight gain: Secondary | ICD-10-CM

## 2018-10-23 DIAGNOSIS — N952 Postmenopausal atrophic vaginitis: Secondary | ICD-10-CM | POA: Diagnosis not present

## 2018-10-23 DIAGNOSIS — N941 Unspecified dyspareunia: Secondary | ICD-10-CM

## 2018-10-23 DIAGNOSIS — N939 Abnormal uterine and vaginal bleeding, unspecified: Secondary | ICD-10-CM

## 2018-10-23 DIAGNOSIS — N882 Stricture and stenosis of cervix uteri: Secondary | ICD-10-CM | POA: Diagnosis not present

## 2018-10-23 NOTE — Patient Instructions (Signed)

## 2018-10-23 NOTE — Progress Notes (Signed)
GYNECOLOGY  VISIT   HPI: 53 y.o.   Married White or Caucasian Not Hispanic or Latino  female   [phone] with Patient's last menstrual period was 09/28/2012.   here for Pelvic US, the patient was seen early this week with  AUB several months after IUD removal and long term c/o deep dyspareunia. She has an elevated FSH and estradiol.  Vaginal estrogen was called in for her, it was $300, needs an alternative.  In the last year she has gained 10-15 lbs, no change in diet or exercise. She has actually increased her exercise in the last year. Did 3 months of weight watchers and lost 2 lbs.    Normal TSH in 8/19.  GYNECOLOGIC HISTORY: Patient's last menstrual period was 09/28/2012. Contraception:PMP Menopausal hormone therapy: none        OB History    Gravida  2   Para  2   Term  2   Preterm      AB      Living  2     SAB      TAB      Ectopic      Multiple      Live Births                 Patient Active Problem List   Diagnosis Date Noted  . Migraines 12/28/2016  . 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 Anxiety   . Depression   . IUD (intrauterine device) in place 07/12/2008   Expires 07/12/2013  . IUD (intrauterine device) in place 07/14/2013   Exp. 07/14/2018  . TMJ (temporomandibular joint disorder)     Past Surgical History:  Procedure Laterality Date  . CESAREAN SECTION     x 2    Current Outpatient Medications  Medication Sig Dispense Refill  . Multiple Vitamin (MULTIVITAMIN) tablet Take 1 tablet by mouth daily.    . SUMAtriptan (IMITREX) 100 MG tablet Take 1 tablet by mouth daily as needed.    . topiramate (TOPAMAX) 25 MG tablet Take 25 mg by mouth 2 (two) times daily.    . Estradiol 10 MCG TABS vaginal tablet Place 1 tablet (10 mcg total) vaginally 2 (two) times a week. (Patient not taking: Reported on 10/23/2018) 24 tablet 0  . medroxyPROGESTERone (PROVERA) 5 MG tablet Take one tablet a day for 5 days every  other month until you don't bleed at all for 6 months. Start May 1 (Patient not taking: Reported on 10/23/2018) 15 tablet 1   No current facility-administered medications for this visit.      ALLERGIES: Patient has no known allergies.  Family History  Problem Relation Age of Onset  . Diabetes Paternal Grandfather   . Hypertension Paternal Grandfather   . Kidney cancer Paternal Grandfather   . Stroke Paternal Grandfather   . Breast cancer Mother   . COPD Mother   . Hypertension Paternal Grandmother   . Hypertension Father     Social History   Socioeconomic History  . Marital status: Married    Spouse name: Not on file  . Number of children: 2  . Years of education: Not on file  . Highest education level: Not on file  Occupational History  . Occupation: self employed    Employer: [employer]  Social Needs  . Financial resource strain: Not on file  . Food insecurity:    Worry: Not on file    Inability:  Not on file  . Transportation needs:    Medical: Not on file    Non-medical: Not on file  Tobacco Use  . Smoking status: Former Research scientist (life sciences)  . Smokeless tobacco: Never Used  Substance and Sexual Activity  . Alcohol use: No  . Drug use: No  . Sexual activity: Yes    Partners: Male    Birth control/protection: Post-menopausal    Comment: occ sexually active  Lifestyle  . Physical activity:    Days per week: Not on file    Minutes per session: Not on file  . Stress: Not on file  Relationships  . Social connections:    Talks on phone: Not on file    Gets together: Not on file    Attends religious service: Not on file    Active member of club or organization: Not on file    Attends meetings of clubs or organizations: Not on file    Relationship status: Not on file  . Intimate partner violence:    Fear of current or ex partner: Not on file    Emotionally abused: Not on file    Physically abused: Not on file    Forced sexual activity: Not on file  Other Topics  Concern  . Not on file  Social History Narrative  . Not on file    Review of Systems  Constitutional: Negative.   HENT: Negative.   Eyes: Negative.   Respiratory: Negative.   Cardiovascular: Negative.   Gastrointestinal: Negative.   Genitourinary: Negative.   Musculoskeletal: Negative.   Skin: Negative.   Neurological: Negative.   Endo/Heme/Allergies: Negative.   Psychiatric/Behavioral: Negative.   All other systems reviewed and are negative.   PHYSICAL EXAMINATION:    BP (!) 142/82   Pulse 64   Temp 97.6 F (36.4 C) (Oral)   Ht 5' 3.75" (1.619 m)   Wt 153 lb (69.4 kg)   LMP 09/28/2012 Comment: some spotting 10-12-2018  BMI 26.47 kg/m     General appearance: alert, cooperative and appears stated age  Pelvic: External genitalia:  no lesions              Urethra:  normal appearing urethra with no masses, tenderness or lesions              Bartholins and Skenes: normal                 Vagina: normal appearing vagina with normal color and discharge, no lesions              Cervix: no lesions  Sonohysterogram The procedure and risks of the procedure were reviewed with the patient, consent form was signed. A speculum was placed in the vagina and the cervix was cleansed with betadine. A tenaculum was placed and the cervix was dilated with a mini-dilator. The sonohysterogram catheter was inserted into the uterine cavity without difficulty. The tenaculum and speculum were removed.  Saline was infused under direct observation with the ultrasound.There was endometrial thickening laterally on the uterine wall, ? Sessile polyp or possible hyperplasia. The catheter was removed.   The risks of endometrial biopsy were reviewed and a consent was obtained.  A speculum was placed in the vagina and the cervix was cleansed with betadine. The mini-pipelle was placed into the endometrial cavity. The uterus sounded to 7 cm. The endometrial biopsy was performed, minimal tissue was obtained, 2  passes were made. The speculum was removed. There were no complications.  Chaperone was present for exam.  ASSESSMENT Episode of AUB Vaginal atrophy, vaginal estrogen tablets too expensive Weight gain, normal TSH in 8/19 Elevated BP without diagnosis of HTN Dyspareunia, entry and deep. No finding on ultrasound to explain deep dyspareunia. Negative pelvic exam earlier this week including negative evaluation of the pelvic floor    PLAN Sonohysterogram with asymmetric thickening of the endometrium, possible sessile polyp, hyperplasia or artifact Endometrial biopsy done Schedule hysteroscopy, D&C Repeat BP 126/80 Discussed weight loss Will find alternative vaginal estrogen for her Discussed waiting to have intercourse until she has been using vaginal estrogen for 2-4 weeks. Use lubricant and she should control rate and depth of penetration   An After Visit Summary was printed and given to the patient.  ~25 minutes face to face time of which over 50% was spent in counseling.

## 2018-10-28 ENCOUNTER — Other Ambulatory Visit: Payer: Self-pay

## 2018-10-28 NOTE — Telephone Encounter (Signed)
-----   Message from Salvadore Dom, MD sent at 10/23/2018  1:20 PM EDT ----- Can you please check with the pharmacy if they cover estrace or premarin cream, if not we need to call it in to the compounding pharmacy.  Estrace 1 gm vaginally 2 x a week at hs Premarin 0.5 gm vaginally 2 x a week at hs Thanks, Sharee Pimple PS: please let the patient know

## 2018-10-28 NOTE — Telephone Encounter (Signed)
Left message to call [place] at [phone].  Per review of patients formulary Premarin vaginal cream is the lowest tier medication and should be the most cost effective. Need to know what pharmacy to send medication to.

## 2018-10-29 MED ORDER — ESTROGENS, CONJUGATED 0.625 MG/GM VA CREA: TOPICAL_CREAM | VAGINAL | 0 refills | Status: DC

## 2018-10-29 NOTE — Telephone Encounter (Signed)
Patient returning call.

## 2018-10-29 NOTE — Telephone Encounter (Signed)
Spoke with patient. Advised that Premarin cream is the lowest tier with her insurance and should be the most cost effective. Patient verbalizes understanding. Rx for Premarin 0.5 gm vaginally twice a week at bedtime #30 g 0RF sent to pharmacy on file. Patient will check with the pharmacy regarding cost. If this is too costly will return call to discuss compounded cream.  Routing to provider and will close encounter.

## 2018-10-30 ENCOUNTER — Telehealth: Payer: Self-pay | Admitting: Obstetrics and Gynecology

## 2018-10-30 NOTE — Telephone Encounter (Signed)
Patient would like to speak with nurse about the prescription for premarin.

## 2018-10-30 NOTE — Telephone Encounter (Signed)
Left message to call Munachimso Palin at [phone]. 

## 2018-10-31 MED ORDER — NONFORMULARY OR COMPOUNDED ITEM: 0 refills | Status: DC

## 2018-10-31 NOTE — Telephone Encounter (Signed)
Spoke with patient. Patient states that Premarin cream is still too costly with her insurance. Would like compounded cream to a pharmacy in [place], [place]. Rx for Estrace 0.02% insert 0.5 gram pv twice weekly dispense 1 month 0RF. Patient only wants 1 month supply at this time to see how she feels with the medication. Wants this to go to [facility]. Rx to Dr.Jertson.

## 2018-11-14 ENCOUNTER — Telehealth: Payer: Self-pay | Admitting: *Deleted

## 2018-11-14 NOTE — Telephone Encounter (Signed)
Call to patient regarding scheduling of surgical procedure. Hospital is working to resume scheduling of elective procedures following Covid 19. Patient states she has been self- quarantined with family. Only had for minimal basics to store- advised to wear face mask if out. Discussed potential dates and patient is agreeable to 11-25-18 but aware this is subject to patient qualifications/testing and changes based on Covid census.  Will call her back with additional information as soon as available.

## 2018-11-18 NOTE — Telephone Encounter (Signed)
Call to patient. Advised surgery date confirmed for 11-25-18 at [facility].  Surgery instructions reviewed and printed copy will be provided at consult on 11-20-2018.  Instructed to expect call from [facility] with additional information regarding guidelines related to Covid 19.   Routing to Dr Talbert Nan. Encounter closed.

## 2018-11-19 ENCOUNTER — Telehealth: Payer: Self-pay | Admitting: Obstetrics and Gynecology

## 2018-11-19 NOTE — Telephone Encounter (Signed)
Patient is scheduled to have surgery next Tuesday and is questioning if she can take her migraine medication?

## 2018-11-19 NOTE — Telephone Encounter (Signed)
Spoke with patient, advised as seen below per Dr. Jertson. Patient verbalizes understanding and is agreeable. Encounter closed.  

## 2018-11-19 NOTE — Progress Notes (Signed)
GYNECOLOGY  VISIT   HPI: 53 y.o.   Married White or Caucasian Not Hispanic or Latino  female   [phone] with Patient's last menstrual period was 09/28/2012.   here for a pre-op for a hysteroscopy. She presented last month with abnormal uterine bleeding. She had a mirena IUD removed in 12/19 after having an elevated FSH of 79.1 several months prior. The patient had no bleeding with the mirena or after removal until mid March. She started bleeding on 10/12/18 and bleed x 5 days.  Sonohysterogram showed isolated endometrial thickening concerning for a sessile polyp or possible hyperplasia. An endometrial biopsy returned with inactive endometrium.   GYNECOLOGIC HISTORY: Patient's last menstrual period was 09/28/2012. Contraception: PMP Menopausal hormone therapy: vaginal estrogen        OB History    Gravida  2   Para  2   Term  2   Preterm      AB      Living  2     SAB      TAB      Ectopic      Multiple      Live Births                 Patient Active Problem List   Diagnosis Date Noted  . Migraines 12/28/2016  . 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 Abnormal uterine bleeding (AUB)   . Anxiety    Resolved after death of mother  . Depression    Resolved after death of mother  . IUD (intrauterine device) in place 07/12/2008   Expires 07/12/2013  . IUD (intrauterine device) in place 07/14/2013   Exp. 07/14/2018  . Migraine   . TMJ (temporomandibular joint disorder)    pt denies  . 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 Wears contact lenses   . Wears glasses     Past Surgical History:  Procedure Laterality Date  . CESAREAN SECTION     x 2  . FRACTURE SURGERY Right    ARM put to sleep to reset fracture  . IUD REMOVAL  06/2018  . WISDOM TOOTH EXTRACTION      Current Outpatient Medications  Medication Sig Dispense Refill  . estradiol (ESTRACE) 0.1 MG/GM vaginal cream     . medroxyPROGESTERone (PROVERA) 5 MG tablet  Take one tablet a day for 5 days every other month until you don't bleed at all for 6 months. Start May 1 15 tablet 1  . SUMAtriptan (IMITREX) 100 MG tablet Take 1 tablet by mouth daily as needed for migraine.     . Multiple Vitamin (MULTIVITAMIN) tablet Take 1 tablet by mouth daily.    Marland Kitchen OVER THE COUNTER MEDICATION Arbonne Protein Powder Shakes    . OVER THE COUNTER MEDICATION Detox Green Tea    . OVER THE COUNTER MEDICATION Pre and Probiotic Drink     No current facility-administered medications for this visit.    Not taking her herbal products since last week. She took the provera 5/1-5/5  ALLERGIES: Patient has no known allergies.  Family History  Problem Relation Age of Onset  . Diabetes Paternal Grandfather   . Hypertension Paternal Grandfather   . Kidney cancer Paternal Grandfather   . Stroke Paternal Grandfather   . Breast cancer Mother   . COPD Mother   . Hypertension Paternal Grandmother   . Hypertension Father     Social History   Socioeconomic History  .  Marital status: Married    Spouse name: Not on file  . Number of children: 2  . Years of education: Not on file  . Highest education level: Not on file  Occupational History  . Occupation: self employed    Employer: [employer]  Social Needs  . Financial resource strain: Not on file  . Food insecurity:    Worry: Not on file    Inability: Not on file  . Transportation needs:    Medical: Not on file    Non-medical: Not on file  Tobacco Use  . Smoking status: Former Smoker    Types: Cigarettes  . Smokeless tobacco: Never Used  . Tobacco comment: over 20 years  Substance and Sexual Activity  . Alcohol use: No  . Drug use: No  . Sexual activity: Yes    Partners: Male    Birth control/protection: Post-menopausal    Comment: occ sexually active  Lifestyle  . Physical activity:    Days per week: Not on file    Minutes per session: Not on file  . Stress: Not on file  Relationships  . Social  connections:    Talks on phone: Not on file    Gets together: Not on file    Attends religious service: Not on file    Active member of club or organization: Not on file    Attends meetings of clubs or organizations: Not on file    Relationship status: Not on file  . Intimate partner violence:    Fear of current or ex partner: Not on file    Emotionally abused: Not on file    Physically abused: Not on file    Forced sexual activity: Not on file  Other Topics Concern  . Not on file  Social History Narrative  . Not on file    Review of Systems  Constitutional: Negative.   HENT: Negative.   Eyes: Negative.   Respiratory: Negative.   Cardiovascular: Negative.   Gastrointestinal: Negative.   Genitourinary: Negative.   Musculoskeletal: Negative.   Skin: Negative.   Neurological: Negative.   Endo/Heme/Allergies: Negative.   Psychiatric/Behavioral: Negative.   All other systems reviewed and are negative.   PHYSICAL EXAMINATION:    BP 128/90   Pulse 84   Temp 98.1 F (36.7 C) (Oral)   Ht 5\' 4"  (1.626 m)   Wt 152 lb (68.9 kg)   LMP 09/28/2012 Comment: some spotting 10-12-2018  BMI 26.09 kg/m     General appearance: alert, cooperative and appears stated age Neck: no adenopathy, supple, symmetrical, trachea midline and thyroid normal to inspection and palpation Heart: regular rate and rhythm Lungs: CTAB Abdomen: soft, non-tender; bowel sounds normal; no masses,  no organomegaly Extremities: normal, atraumatic, no cyanosis Skin: normal color, texture and turgor, no rashes or lesions Lymph: normal cervical supraclavicular and inguinal nodes Neurologic: grossly normal   ASSESSMENT Abnormal uterine bleeding, sonohysterogram with asymmetric thickening (possible polyp, hyperplasia or artifact). Biopsy was benign.     PLAN Plan: hysteroscopy, dilation and curettage. Reviewed risks, including: bleeding, infection, uterine perforation, need for further sugery    An After  Visit Summary was printed and given to the patient.

## 2018-11-19 NOTE — H&P (View-Only) (Signed)
GYNECOLOGY  VISIT   HPI: 53 y.o.   Married White or Caucasian Not Hispanic or Latino  female   [phone] with Patient's last menstrual period was 09/28/2012.   here for a pre-op for a hysteroscopy. She presented last month with abnormal uterine bleeding. She had a mirena IUD removed in 12/19 after having an elevated FSH of 79.1 several months prior. The patient had no bleeding with the mirena or after removal until mid March. She started bleeding on 10/12/18 and bleed x 5 days.  Sonohysterogram showed isolated endometrial thickening concerning for a sessile polyp or possible hyperplasia. An endometrial biopsy returned with inactive endometrium.   GYNECOLOGIC HISTORY: Patient's last menstrual period was 09/28/2012. Contraception: PMP Menopausal hormone therapy: vaginal estrogen        OB History    Gravida  2   Para  2   Term  2   Preterm      AB      Living  2     SAB      TAB      Ectopic      Multiple      Live Births                 Patient Active Problem List   Diagnosis Date Noted  . Migraines 12/28/2016  . 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 Abnormal uterine bleeding (AUB)   . Anxiety    Resolved after death of mother  . Depression    Resolved after death of mother  . IUD (intrauterine device) in place 07/12/2008   Expires 07/12/2013  . IUD (intrauterine device) in place 07/14/2013   Exp. 07/14/2018  . Migraine   . TMJ (temporomandibular joint disorder)    pt denies  . 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 Wears contact lenses   . Wears glasses     Past Surgical History:  Procedure Laterality Date  . CESAREAN SECTION     x 2  . FRACTURE SURGERY Right    ARM put to sleep to reset fracture  . IUD REMOVAL  06/2018  . WISDOM TOOTH EXTRACTION      Current Outpatient Medications  Medication Sig Dispense Refill  . estradiol (ESTRACE) 0.1 MG/GM vaginal cream     . medroxyPROGESTERone (PROVERA) 5 MG tablet  Take one tablet a day for 5 days every other month until you don't bleed at all for 6 months. Start May 1 15 tablet 1  . SUMAtriptan (IMITREX) 100 MG tablet Take 1 tablet by mouth daily as needed for migraine.     . Multiple Vitamin (MULTIVITAMIN) tablet Take 1 tablet by mouth daily.    Marland Kitchen OVER THE COUNTER MEDICATION Arbonne Protein Powder Shakes    . OVER THE COUNTER MEDICATION Detox Green Tea    . OVER THE COUNTER MEDICATION Pre and Probiotic Drink     No current facility-administered medications for this visit.    Not taking her herbal products since last week. She took the provera 5/1-5/5  ALLERGIES: Patient has no known allergies.  Family History  Problem Relation Age of Onset  . Diabetes Paternal Grandfather   . Hypertension Paternal Grandfather   . Kidney cancer Paternal Grandfather   . Stroke Paternal Grandfather   . Breast cancer Mother   . COPD Mother   . Hypertension Paternal Grandmother   . Hypertension Father     Social History   Socioeconomic History  .  Marital status: Married    Spouse name: Not on file  . Number of children: 2  . Years of education: Not on file  . Highest education level: Not on file  Occupational History  . Occupation: self employed    Employer: [employer]  Social Needs  . Financial resource strain: Not on file  . Food insecurity:    Worry: Not on file    Inability: Not on file  . Transportation needs:    Medical: Not on file    Non-medical: Not on file  Tobacco Use  . Smoking status: Former Smoker    Types: Cigarettes  . Smokeless tobacco: Never Used  . Tobacco comment: over 20 years  Substance and Sexual Activity  . Alcohol use: No  . Drug use: No  . Sexual activity: Yes    Partners: Male    Birth control/protection: Post-menopausal    Comment: occ sexually active  Lifestyle  . Physical activity:    Days per week: Not on file    Minutes per session: Not on file  . Stress: Not on file  Relationships  . Social  connections:    Talks on phone: Not on file    Gets together: Not on file    Attends religious service: Not on file    Active member of club or organization: Not on file    Attends meetings of clubs or organizations: Not on file    Relationship status: Not on file  . Intimate partner violence:    Fear of current or ex partner: Not on file    Emotionally abused: Not on file    Physically abused: Not on file    Forced sexual activity: Not on file  Other Topics Concern  . Not on file  Social History Narrative  . Not on file    Review of Systems  Constitutional: Negative.   HENT: Negative.   Eyes: Negative.   Respiratory: Negative.   Cardiovascular: Negative.   Gastrointestinal: Negative.   Genitourinary: Negative.   Musculoskeletal: Negative.   Skin: Negative.   Neurological: Negative.   Endo/Heme/Allergies: Negative.   Psychiatric/Behavioral: Negative.   All other systems reviewed and are negative.   PHYSICAL EXAMINATION:    BP 128/90   Pulse 84   Temp 98.1 F (36.7 C) (Oral)   Ht 5\' 4"  (1.626 m)   Wt 152 lb (68.9 kg)   LMP 09/28/2012 Comment: some spotting 10-12-2018  BMI 26.09 kg/m     General appearance: alert, cooperative and appears stated age Neck: no adenopathy, supple, symmetrical, trachea midline and thyroid normal to inspection and palpation Heart: regular rate and rhythm Lungs: CTAB Abdomen: soft, non-tender; bowel sounds normal; no masses,  no organomegaly Extremities: normal, atraumatic, no cyanosis Skin: normal color, texture and turgor, no rashes or lesions Lymph: normal cervical supraclavicular and inguinal nodes Neurologic: grossly normal   ASSESSMENT Abnormal uterine bleeding, sonohysterogram with asymmetric thickening (possible polyp, hyperplasia or artifact). Biopsy was benign.     PLAN Plan: hysteroscopy, dilation and curettage. Reviewed risks, including: bleeding, infection, uterine perforation, need for further sugery    An After  Visit Summary was printed and given to the patient.

## 2018-11-19 NOTE — Telephone Encounter (Signed)
Spoke with patient. Scheduled for D& C on 11/25/18, feels a migraine coming on. Has RX Imitrex 100 mg that she takes prn, asking if she can take? Denies any other symptoms. Advised I will review with Dr. Talbert Nan and return call with recommendations. Patient agreeable.   Dr. Talbert Nan -please advise on Imitrex?

## 2018-11-19 NOTE — Telephone Encounter (Signed)
Yes, Imitrex is fine.

## 2018-11-20 ENCOUNTER — Encounter (HOSPITAL_BASED_OUTPATIENT_CLINIC_OR_DEPARTMENT_OTHER): Payer: Self-pay

## 2018-11-20 ENCOUNTER — Encounter: Payer: Self-pay | Admitting: Obstetrics and Gynecology

## 2018-11-20 ENCOUNTER — Ambulatory Visit (INDEPENDENT_AMBULATORY_CARE_PROVIDER_SITE_OTHER): Payer: BLUE CROSS/BLUE SHIELD | Admitting: Obstetrics and Gynecology

## 2018-11-20 ENCOUNTER — Other Ambulatory Visit: Payer: Self-pay

## 2018-11-20 ENCOUNTER — Other Ambulatory Visit (HOSPITAL_COMMUNITY)
Admission: RE | Admit: 2018-11-20 | Discharge: 2018-11-20 | Disposition: A | Discharge status: Home / Self Care | Payer: BLUE CROSS/BLUE SHIELD | Source: Ambulatory Visit | Attending: Obstetrics and Gynecology | Admitting: Obstetrics and Gynecology

## 2018-11-20 VITALS — BP 128/90 | HR 84 | Temp 98.1°F | Ht 64.0 in | Wt 152.0 lb

## 2018-11-20 DIAGNOSIS — N939 Abnormal uterine and vaginal bleeding, unspecified: Secondary | ICD-10-CM

## 2018-11-20 DIAGNOSIS — Z1159 Encounter for screening for other viral diseases: Secondary | ICD-10-CM | POA: Insufficient documentation

## 2018-11-20 NOTE — Progress Notes (Signed)
SPOKE W/  Mary Bolton     SCREENING SYMPTOMS OF COVID 19:   COUGH--NO  RUNNY NOSE--- NO  SORE THROAT---NO  NASAL CONGESTION----NO  SNEEZING----NO  SHORTNESS OF BREATH---NO  DIFFICULTY BREATHING---NO  TEMP >100.0 -----NO  UNEXPLAINED BODY ACHES------No  CHILLS -------- NO  HEADACHES ---------No  LOSS OF SMELL/ TASTE --------NO    HAVE YOU OR ANY FAMILY MEMBER TRAVELLED PAST 14 DAYS OUT OF THE   COUNTY---Lives in [place]----Lives in [place] COUNTRY----NO  HAVE YOU OR ANY FAMILY MEMBER BEEN EXPOSED TO ANYONE WITH COVID 19? NO

## 2018-11-20 NOTE — Progress Notes (Addendum)
Spoke with: Olivia Mackie NPO:   No food after midnight/Clear liquids until 4:30 AM DOS/DRINK Arrival time: [id number] Labs: Hgb, UPT (COVID 11/20/2018) AM medications: None Pre op orders:Yes  Ride home:  Merrilee Seashore (husband) [phone]

## 2018-11-21 LAB — NOVEL CORONAVIRUS, NAA (HOSP ORDER, SEND-OUT TO REF LAB; TAT 18-24 HRS): SARS-CoV-2, NAA: NOT DETECTED

## 2018-11-24 NOTE — Anesthesia Preprocedure Evaluation (Addendum)
Anesthesia Evaluation  Patient identified by MRN, date of birth, ID band Patient awake    Reviewed: Allergy & Precautions, NPO status , Patient's Chart, lab work & pertinent test results  History of Anesthesia Complications Negative for: history of anesthetic complications  Airway Mallampati: II  TM Distance: >3 FB Neck ROM: Full    Dental no notable dental hx. (+) Dental Advisory Given   Pulmonary former smoker,    Pulmonary exam normal breath sounds clear to auscultation       Cardiovascular negative cardio ROS Normal cardiovascular exam Rhythm:Regular Rate:Normal     Neuro/Psych  Headaches, negative psych ROS   GI/Hepatic negative GI ROS, Neg liver ROS,   Endo/Other  negative endocrine ROS  Renal/GU negative Renal ROS     Musculoskeletal negative musculoskeletal ROS (+)   Abdominal   Peds  Hematology negative hematology ROS (+)   Anesthesia Other Findings Day of surgery medications reviewed with the patient.  Reproductive/Obstetrics Abnormal uterine bleeding                            Anesthesia Physical Anesthesia Plan  ASA: II  Anesthesia Plan: General   Post-op Pain Management:    Induction: Intravenous  PONV Risk Score and Plan: 3 and Midazolam, Dexamethasone, Ondansetron and Treatment may vary due to age or medical condition  Airway Management Planned: LMA  Additional Equipment: None  Intra-op Plan:   Post-operative Plan: Extubation in OR  Informed Consent: I have reviewed the patients History and Physical, chart, labs and discussed the procedure including the risks, benefits and alternatives for the proposed anesthesia with the patient or authorized representative who has indicated his/her understanding and acceptance.     Dental advisory given  Plan Discussed with: CRNA  Anesthesia Plan Comments:        Anesthesia Quick Evaluation

## 2018-11-25 ENCOUNTER — Ambulatory Visit (HOSPITAL_BASED_OUTPATIENT_CLINIC_OR_DEPARTMENT_OTHER): Payer: BLUE CROSS/BLUE SHIELD | Admitting: Anesthesiology

## 2018-11-25 ENCOUNTER — Encounter (HOSPITAL_BASED_OUTPATIENT_CLINIC_OR_DEPARTMENT_OTHER): Payer: Self-pay

## 2018-11-25 ENCOUNTER — Encounter (HOSPITAL_BASED_OUTPATIENT_CLINIC_OR_DEPARTMENT_OTHER)
Admission: RE | Disposition: A | Discharge status: Home / Self Care | Payer: Self-pay | Source: Home / Self Care | Attending: Obstetrics and Gynecology

## 2018-11-25 ENCOUNTER — Ambulatory Visit (HOSPITAL_BASED_OUTPATIENT_CLINIC_OR_DEPARTMENT_OTHER)
Admission: RE | Admit: 2018-11-25 | Discharge: 2018-11-25 | Disposition: A | Discharge status: Home / Self Care | Payer: BLUE CROSS/BLUE SHIELD | Attending: Obstetrics and Gynecology | Admitting: Obstetrics and Gynecology

## 2018-11-25 DIAGNOSIS — Z87891 Personal history of nicotine dependence: Secondary | ICD-10-CM | POA: Insufficient documentation

## 2018-11-25 DIAGNOSIS — N939 Abnormal uterine and vaginal bleeding, unspecified: Secondary | ICD-10-CM | POA: Diagnosis not present

## 2018-11-25 DIAGNOSIS — G43909 Migraine, unspecified, not intractable, without status migrainosus: Secondary | ICD-10-CM | POA: Insufficient documentation

## 2018-11-25 DIAGNOSIS — Z79890 Hormone replacement therapy (postmenopausal): Secondary | ICD-10-CM | POA: Insufficient documentation

## 2018-11-25 HISTORY — PX: DILATATION & CURETTAGE/HYSTEROSCOPY WITH MYOSURE: SHX6511

## 2018-11-25 HISTORY — DX: Abnormal uterine and vaginal bleeding, unspecified: N93.9

## 2018-11-25 HISTORY — DX: Thyrotoxicosis with toxic single thyroid nodule without thyrotoxic crisis or storm: E05.10

## 2018-11-25 HISTORY — DX: Presence of spectacles and contact lenses: Z97.3

## 2018-11-25 HISTORY — DX: Migraine, unspecified, not intractable, without status migrainosus: G43.909

## 2018-11-25 LAB — POCT HEMOGLOBIN-HEMACUE: Hemoglobin: 13.8 g/dL (ref 12.0–15.0)

## 2018-11-25 LAB — POCT PREGNANCY, URINE: Preg Test, Ur: NEGATIVE

## 2018-11-25 SURGERY — DILATATION & CURETTAGE/HYSTEROSCOPY WITH MYOSURE
Anesthesia: General | Site: Vagina

## 2018-11-25 MED ORDER — LACTATED RINGERS IV SOLN
INTRAVENOUS | Status: DC
  Administered 2018-11-25: 07:00:00 via INTRAVENOUS
  Filled 2018-11-25: qty 1000

## 2018-11-25 MED ORDER — KETOROLAC TROMETHAMINE 30 MG/ML IJ SOLN
INTRAMUSCULAR | Status: AC
  Filled 2018-11-25: qty 1

## 2018-11-25 MED ORDER — DEXAMETHASONE SODIUM PHOSPHATE 10 MG/ML IJ SOLN
INTRAMUSCULAR | Status: DC | PRN
  Administered 2018-11-25: 10 mg via INTRAVENOUS

## 2018-11-25 MED ORDER — PROPOFOL 10 MG/ML IV BOLUS
INTRAVENOUS | Status: DC | PRN
  Administered 2018-11-25: 120 mg via INTRAVENOUS

## 2018-11-25 MED ORDER — DEXAMETHASONE SODIUM PHOSPHATE 10 MG/ML IJ SOLN
INTRAMUSCULAR | Status: AC
  Filled 2018-11-25: qty 1

## 2018-11-25 MED ORDER — OXYCODONE HCL 5 MG PO TABS
5.0000 mg | ORAL_TABLET | Freq: Once | ORAL | Status: DC | PRN
  Filled 2018-11-25: qty 1

## 2018-11-25 MED ORDER — LIDOCAINE 2% (20 MG/ML) 5 ML SYRINGE
INTRAMUSCULAR | Status: AC
  Filled 2018-11-25: qty 5

## 2018-11-25 MED ORDER — FENTANYL CITRATE (PF) 100 MCG/2ML IJ SOLN
INTRAMUSCULAR | Status: AC
  Filled 2018-11-25: qty 2

## 2018-11-25 MED ORDER — PROPOFOL 10 MG/ML IV BOLUS
INTRAVENOUS | Status: AC
  Filled 2018-11-25: qty 20

## 2018-11-25 MED ORDER — ONDANSETRON HCL 4 MG/2ML IJ SOLN
INTRAMUSCULAR | Status: AC
  Filled 2018-11-25: qty 2

## 2018-11-25 MED ORDER — SODIUM CHLORIDE 0.9 % IR SOLN
Status: DC | PRN
  Administered 2018-11-25: 3000 mL

## 2018-11-25 MED ORDER — OXYCODONE HCL 5 MG/5ML PO SOLN
5.0000 mg | Freq: Once | ORAL | Status: DC | PRN
  Filled 2018-11-25: qty 5

## 2018-11-25 MED ORDER — PROMETHAZINE HCL 25 MG/ML IJ SOLN
6.2500 mg | INTRAMUSCULAR | Status: DC | PRN
  Filled 2018-11-25: qty 1

## 2018-11-25 MED ORDER — LIDOCAINE 2% (20 MG/ML) 5 ML SYRINGE
INTRAMUSCULAR | Status: DC | PRN
  Administered 2018-11-25: 60 mg via INTRAVENOUS

## 2018-11-25 MED ORDER — MIDAZOLAM HCL 2 MG/2ML IJ SOLN
INTRAMUSCULAR | Status: AC
  Filled 2018-11-25: qty 2

## 2018-11-25 MED ORDER — MIDAZOLAM HCL 2 MG/2ML IJ SOLN
INTRAMUSCULAR | Status: DC | PRN
  Administered 2018-11-25: 2 mg via INTRAVENOUS

## 2018-11-25 MED ORDER — FENTANYL CITRATE (PF) 100 MCG/2ML IJ SOLN
INTRAMUSCULAR | Status: DC | PRN
  Administered 2018-11-25: 50 ug via INTRAVENOUS

## 2018-11-25 MED ORDER — ACETAMINOPHEN 500 MG PO TABS
ORAL_TABLET | ORAL | Status: AC
  Filled 2018-11-25: qty 2

## 2018-11-25 MED ORDER — ACETAMINOPHEN 500 MG PO TABS
1000.0000 mg | ORAL_TABLET | Freq: Once | ORAL | Status: AC
  Administered 2018-11-25: 07:00:00 1000 mg via ORAL
  Filled 2018-11-25: qty 2

## 2018-11-25 MED ORDER — FENTANYL CITRATE (PF) 100 MCG/2ML IJ SOLN
25.0000 ug | INTRAMUSCULAR | Status: DC | PRN
  Filled 2018-11-25: qty 1

## 2018-11-25 SURGICAL SUPPLY — 25 items
CANISTER SUCT 3000ML PPV (MISCELLANEOUS) ×6 IMPLANT
CATH ROBINSON RED A/P 16FR (CATHETERS) IMPLANT
COVER WAND RF STERILE (DRAPES) ×3 IMPLANT
DEVICE MYOSURE LITE (MISCELLANEOUS) IMPLANT
DEVICE MYOSURE REACH (MISCELLANEOUS) IMPLANT
DILATOR CANAL MILEX (MISCELLANEOUS) IMPLANT
GAUZE 4X4 16PLY RFD (DISPOSABLE) ×3 IMPLANT
GLOVE BIO SURGEON STRL SZ 6.5 (GLOVE) ×4 IMPLANT
GLOVE BIO SURGEON STRL SZ7 (GLOVE) ×3 IMPLANT
GLOVE BIO SURGEONS STRL SZ 6.5 (GLOVE) ×2
GLOVE BIOGEL PI IND STRL 6.5 (GLOVE) ×1 IMPLANT
GLOVE BIOGEL PI INDICATOR 6.5 (GLOVE) ×2
GOWN STRL REUS W/TWL LRG LVL3 (GOWN DISPOSABLE) ×9 IMPLANT
IV NS IRRIG 3000ML ARTHROMATIC (IV SOLUTION) ×3 IMPLANT
KIT PROCEDURE FLUENT (KITS) ×3 IMPLANT
KIT TURNOVER CYSTO (KITS) ×3 IMPLANT
MYOSURE XL FIBROID (MISCELLANEOUS)
PACK VAGINAL MINOR WOMEN LF (CUSTOM PROCEDURE TRAY) ×3 IMPLANT
PAD OB MATERNITY 4.3X12.25 (PERSONAL CARE ITEMS) ×3 IMPLANT
PAD PREP 24X48 CUFFED NSTRL (MISCELLANEOUS) ×3 IMPLANT
SEAL ROD LENS SCOPE MYOSURE (ABLATOR) ×3 IMPLANT
SYR 20CC LL (SYRINGE) IMPLANT
SYSTEM TISS REMOVAL MYOSURE XL (MISCELLANEOUS) IMPLANT
TOWEL OR 17X26 10 PK STRL BLUE (TOWEL DISPOSABLE) ×6 IMPLANT
WATER STERILE IRR 500ML POUR (IV SOLUTION) IMPLANT

## 2018-11-25 NOTE — Interval H&P Note (Signed)
History and Physical Interval Note:  11/25/2018 8:23 AM  Mary Bolton  has presented today for surgery, with the diagnosis of AUB.  The various methods of treatment have been discussed with the patient and family. After consideration of risks, benefits and other options for treatment, the patient has consented to  Procedure(s): [place] (N/A) as a surgical intervention.  The patient's history has been reviewed, patient examined, no change in status, stable for surgery.  I have reviewed the patient's chart and labs.  Questions were answered to the patient's satisfaction.     Salvadore Dom

## 2018-11-25 NOTE — Anesthesia Procedure Notes (Signed)
Procedure Name: LMA Insertion Date/Time: 11/25/2018 8:30 AM Performed by: Suan Halter, CRNA Pre-anesthesia Checklist: Patient identified, Emergency Drugs available, Suction available and Patient being monitored Patient Re-evaluated:Patient Re-evaluated prior to induction Oxygen Delivery Method: Circle system utilized Preoxygenation: Pre-oxygenation with 100% oxygen Induction Type: IV induction Ventilation: Mask ventilation without difficulty LMA: LMA inserted LMA Size: 4.0 Number of attempts: 1 Airway Equipment and Method: Bite block Placement Confirmation: positive ETCO2 Tube secured with: Tape Dental Injury: Teeth and Oropharynx as per pre-operative assessment

## 2018-11-25 NOTE — Op Note (Signed)
Preoperative Diagnosis: abnormal uterine bleeding  Postoperative Diagnosis: same  Procedure: Hysteroscopy, dilation and curettage  Surgeon: Dr Sumner Boast  Assistants: None  Anesthesia: General via LMA  EBL: 5 cc  Fluids: 500 cc LR  Fluid deficit: 70 cc  Urine output: not recorded  Indications for surgery: The patient is a 53 yo female, who presented with abnormal uterine bleeding. Work up included a sonohysterogram which showed isolated endometrial thickening concerning for a sessile polyp or possible hyperplasia. Endometrial biopsy returned with atrophy.  The risks of the surgery were reviewed with the patient and the consent form was signed prior to her surgery.  Findings: EUA: normal sized anteverted uterus, no adnexal masses. Hysteroscopy: normal atrophic appearing endometrial cavity, normal tubal ostia bilaterally  Specimens: endometrial curettings   Procedure: The patient was taken to the operating room with an IV in place. She was placed in the dorsal lithotomy position and anesthesia was administered. She was prepped and draped in the usual sterile fashion for a vaginal procedure. She voided on the way to the OR. A weighted speculum was placed in the vagina and a single tooth tenaculum was placed on the anterior lip of the cervix. The cervix was dilated to a #7 hagar dilator. The uterus was sounded to 7 cm. The myosure hysteroscope was inserted into the uterine cavity. With continuous infusion of normal saline, the uterine cavity was visualized with the above findings. The myosure was then removed. The cavity was then curetted with the small sharp curette. The cavity had the characteristically gritty texture at the end of the procedure. The curette was removed and the hysteroscope was reinserted. The cavity had the characteristic post curettage appearance. The hysteroscope and the single tooth tenaculum were removed. Oozing from the tenaculum site was stopped with pressure. The  speculum was removed. The patients perineum was cleansed of betadine and she was taken out of the dorsal lithotomy position.  Upon awakening the LMA was removed and the patient was transferred to the recovery room in stable and awake condition.  The sponge and instrument count were correct. There were no complications.

## 2018-11-25 NOTE — Anesthesia Postprocedure Evaluation (Signed)
Anesthesia Post Note  Patient: ELIZABELLE FITE  Procedure(s) Performed: DILATATION & CURETTAGE/HYSTEROSCOPY (N/A Vagina )     Patient location during evaluation: PACU Anesthesia Type: General Level of consciousness: awake and alert Pain management: pain level controlled Vital Signs Assessment: post-procedure vital signs reviewed and stable Respiratory status: spontaneous breathing, nonlabored ventilation and respiratory function stable Cardiovascular status: blood pressure returned to baseline and stable Postop Assessment: no apparent nausea or vomiting Anesthetic complications: no    Last Vitals:  Vitals:   11/25/18 0915 11/25/18 0930  BP: 124/72 (!) 115/42  Pulse: (!) 58 (!) 51  Resp: 14 14  Temp:    SpO2: 100% 100%    Last Pain:  Vitals:   11/25/18 0621  TempSrc: Oral  PainSc: 0-No pain                 Brennan Bailey

## 2018-11-25 NOTE — Transfer of Care (Signed)
Immediate Anesthesia Transfer of Care Note  Patient: Mary Bolton  Procedure(s) Performed: Procedure(s) (LRB): DILATATION & CURETTAGE/HYSTEROSCOPY (N/A)  Patient Location: PACU  Anesthesia Type: General  Level of Consciousness: awake, oriented, sedated and patient cooperative  Airway & Oxygen Therapy: Patient Spontanous Breathing and Patient connected to face mask oxygen  Post-op Assessment: Report given to PACU RN and Post -op Vital signs reviewed and stable  Post vital signs: Reviewed and stable  Complications: No apparent anesthesia complications Last Vitals:  Vitals Value Taken Time  BP    Temp    Pulse    Resp    SpO2      Last Pain:  Vitals:   11/25/18 0621  TempSrc: Oral  PainSc: 0-No pain      Patients Stated Pain Goal: 6 (11/25/18 8485)

## 2018-11-25 NOTE — Discharge Instructions (Signed)
°  Post Anesthesia Home Care Instructions  Activity: Get plenty of rest for the remainder of the day. A responsible adult should stay with you for 24 hours following the procedure.  For the next 24 hours, DO NOT: -Drive a car -Paediatric nurse -Drink alcoholic beverages -Take any medication unless instructed by your physician -Make any legal decisions or sign important papers.  Meals: Start with liquid foods such as gelatin or soup. Progress to regular foods as tolerated. Avoid greasy, spicy, heavy foods. If nausea and/or vomiting occur, drink only clear liquids until the nausea and/or vomiting subsides. Call your physician if vomiting continues.  Special Instructions/Symptoms: Your throat may feel dry or sore from the anesthesia or the breathing tube placed in your throat during surgery. If this causes discomfort, gargle with warm salt water. The discomfort should disappear within 24 hours.  If you had a scopolamine patch placed behind your ear for the management of post- operative nausea and/or vomiting:  1. The medication in the patch is effective for 72 hours, after which it should be removed.  Wrap patch in a tissue and discard in the trash. Wash hands thoroughly with soap and water. 2. You may remove the patch earlier than 72 hours if you experience unpleasant side effects which may include dry mouth, dizziness or visual disturbances. 3. Avoid touching the patch. Wash your hands with soap and water after contact with the patch.   DISCHARGE INSTRUCTIONS: D&C / D&E The following instructions have been prepared to help you care for yourself upon your return home.   Personal hygiene:  Use sanitary pads for vaginal drainage, not tampons.  Shower the day after your procedure.  NO tub baths, pools or Jacuzzis for 2-3 weeks.  Wipe front to back after using the bathroom.  Activity and limitations:  Do NOT drive or operate any equipment for 24 hours. The effects of anesthesia are  still present and drowsiness may result.  Do NOT rest in bed all day.  Walking is encouraged.  Walk up and down stairs slowly.  You may resume your normal activity in one to two days or as indicated by your physician.  Sexual activity: NO intercourse for at least 2 weeks after the procedure, or as indicated by your physician.  Diet: Eat a light meal as desired this evening. You may resume your usual diet tomorrow.  Return to work: You may resume your work activities in one to two days or as indicated by your doctor.  What to expect after your surgery: Expect to have vaginal bleeding/discharge for 2-3 days and spotting for up to 10 days. It is not unusual to have soreness for up to 1-2 weeks. You may have a slight burning sensation when you urinate for the first day. Mild cramps may continue for a couple of days. You may have a regular period in 2-6 weeks.  Call your doctor for any of the following:  Excessive vaginal bleeding, saturating and changing one pad every hour.  Inability to urinate 6 hours after discharge from hospital.  Pain not relieved by pain medication.  Fever of 100.4 F or greater.  Unusual vaginal discharge or odor.   Call for an appointment:    Patients signature: ______________________  Nurses signature ________________________  Support person's signature_______________________

## 2018-11-26 ENCOUNTER — Encounter (HOSPITAL_BASED_OUTPATIENT_CLINIC_OR_DEPARTMENT_OTHER): Payer: Self-pay | Admitting: Obstetrics and Gynecology

## 2018-12-09 ENCOUNTER — Ambulatory Visit (INDEPENDENT_AMBULATORY_CARE_PROVIDER_SITE_OTHER): Payer: BLUE CROSS/BLUE SHIELD | Admitting: Obstetrics and Gynecology

## 2018-12-09 ENCOUNTER — Encounter: Payer: Self-pay | Admitting: Obstetrics and Gynecology

## 2018-12-09 ENCOUNTER — Other Ambulatory Visit: Payer: Self-pay

## 2018-12-09 VITALS — BP 130/78 | HR 88 | Temp 98.1°F | Wt 151.0 lb

## 2018-12-09 DIAGNOSIS — R6882 Decreased libido: Secondary | ICD-10-CM

## 2018-12-09 DIAGNOSIS — R635 Abnormal weight gain: Secondary | ICD-10-CM

## 2018-12-09 DIAGNOSIS — Z9889 Other specified postprocedural states: Secondary | ICD-10-CM

## 2018-12-09 NOTE — Progress Notes (Signed)
GYNECOLOGY  VISIT   HPI: 53 y.o.   Married White or Caucasian Not Hispanic or Latino  female   [phone] with Patient's last menstrual period was 09/28/2012.   here for 2 week post op s/p hysteroscopy, D&C for AUB. Pathology with inactive endometrium. No post op problems.  She has started having night sweats, mainly just getting warm. Wakes up ~2 x a night, tolerable.  She has been using the vaginal estrogen, hasn't been sexually active yet. She has no libido.  She c/o mood changes, doesn't feel like herself. She is unhappy about weight gain. Eating healthy. She is exercising. Walking and light resistance. Walks 4 days a week for 30 min.   GYNECOLOGIC HISTORY: Patient's last menstrual period was 09/28/2012. Contraception: Postmenopausal Menopausal hormone therapy: Estradiol vaginal cream, Provera        OB History    Gravida  2   Para  2   Term  2   Preterm      AB      Living  2     SAB      TAB      Ectopic      Multiple      Live Births                 Patient Active Problem List   Diagnosis Date Noted  . Migraines 12/28/2016  . 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 Abnormal uterine bleeding (AUB)   . Anxiety    Resolved after death of mother  . Depression    Resolved after death of mother  . IUD (intrauterine device) in place 07/12/2008   Expires 07/12/2013  . IUD (intrauterine device) in place 07/14/2013   Exp. 07/14/2018  . Migraine   . TMJ (temporomandibular joint disorder)    pt denies  . 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 Wears contact lenses   . Wears glasses     Past Surgical History:  Procedure Laterality Date  . CESAREAN SECTION     x 2  . DILATATION & CURETTAGE/HYSTEROSCOPY WITH MYOSURE N/A 11/25/2018   Procedure: DILATATION & CURETTAGE/HYSTEROSCOPY;  Surgeon: Salvadore Dom, MD;  [facility];  Service: Gynecology;  Laterality: N/A;  . FRACTURE SURGERY Right    ARM put to sleep to reset fracture  . IUD REMOVAL  06/2018  . WISDOM TOOTH EXTRACTION      Current Outpatient Medications  Medication Sig Dispense Refill  . estradiol (ESTRACE) 0.1 MG/GM vaginal cream     . medroxyPROGESTERone (PROVERA) 5 MG tablet Take one tablet a day for 5 days every other month until you don't bleed at all for 6 months. Start May 1 15 tablet 1  . Multiple Vitamin (MULTIVITAMIN) tablet Take 1 tablet by mouth daily.    Marland Kitchen OVER THE COUNTER MEDICATION Arbonne Protein Powder Shakes    . OVER THE COUNTER MEDICATION Detox Green Tea    . OVER THE COUNTER MEDICATION Pre and Probiotic Drink    . SUMAtriptan (IMITREX) 100 MG tablet Take 1 tablet by mouth daily as needed for migraine.      No current facility-administered medications for this visit.      ALLERGIES: Patient has no known allergies.  Family History  Problem Relation Age of Onset  . Diabetes Paternal Grandfather   . Hypertension Paternal Grandfather   . Kidney cancer Paternal Grandfather   . Stroke Paternal Grandfather   . Breast  cancer Mother   . COPD Mother   . Hypertension Paternal Grandmother   . Hypertension Father     Social History   Socioeconomic History  . Marital status: Married    Spouse name: Not on file  . Number of children: 2  . Years of education: Not on file  . Highest education level: Not on file  Occupational History  . Occupation: self employed    Employer: [employer]  Social Needs  . Financial resource strain: Not on file  . Food insecurity:    Worry: Not on file    Inability: Not on file  . Transportation needs:    Medical: Not on file    Non-medical: Not on file  Tobacco Use  . Smoking status: Former Smoker    Types: Cigarettes  . Smokeless tobacco: Never Used  . Tobacco comment: over 20 years  Substance and Sexual Activity  . Alcohol use: No  . Drug use: No  . Sexual activity: Yes    Partners: Male    Birth control/protection: Post-menopausal     Comment: occ sexually active  Lifestyle  . Physical activity:    Days per week: Not on file    Minutes per session: Not on file  . Stress: Not on file  Relationships  . Social connections:    Talks on phone: Not on file    Gets together: Not on file    Attends religious service: Not on file    Active member of club or organization: Not on file    Attends meetings of clubs or organizations: Not on file    Relationship status: Not on file  . Intimate partner violence:    Fear of current or ex partner: Not on file    Emotionally abused: Not on file    Physically abused: Not on file    Forced sexual activity: Not on file  Other Topics Concern  . Not on file  Social History Narrative  . Not on file    Review of Systems  Constitutional:       Weight gain  HENT: Negative.   Eyes: Negative.   Respiratory: Negative.   Cardiovascular: Negative.   Gastrointestinal: Negative.   Genitourinary: Negative.   Musculoskeletal: Negative.   Skin: Negative.   Neurological: Negative.   Endo/Heme/Allergies: Negative.   Psychiatric/Behavioral:       Emotional changes    PHYSICAL EXAMINATION:    BP 130/78 (BP Location: Right Arm, Patient Position: Sitting, Cuff Size: Normal)   Pulse 88   Temp 98.1 F (36.7 C) (Skin)   Wt 151 lb (68.5 kg)   LMP 09/28/2012 Comment: some spotting 10-12-2018  BMI 25.92 kg/m     General appearance: alert, cooperative and appears stated age Neck: no adenopathy, supple, symmetrical, trachea midline and thyroid normal to inspection and palpation   ASSESSMENT S/P hysteroscopy, D&C, inactive endometrium Mood changes Can't loose weight Low libido     PLAN She has cyclic provera to take every other month until she goes 6 months without bleeding TSH, testosterone levels Discussed increasing exercise and weight watchers Discussed the option of counseling or medication Information on libido given   An After Visit Summary was printed and given to the  patient.  ~20 minutes face to face time of which over 50% was spent in counseling.

## 2018-12-11 LAB — TESTT+TESTF+SHBG
Sex Hormone Binding: 76.7 nmol/L (ref 17.3–125.0)
Testosterone, Free: 1.4 pg/mL (ref 0.0–4.2)
Testosterone, Total, LC/MS: 19.3 ng/dL

## 2018-12-11 LAB — TSH: TSH: 1.14 u[IU]/mL (ref 0.450–4.500)

## 2018-12-12 ENCOUNTER — Telehealth: Payer: Self-pay | Admitting: Obstetrics and Gynecology

## 2018-12-12 ENCOUNTER — Telehealth: Payer: Self-pay

## 2018-12-12 MED ORDER — NONFORMULARY OR COMPOUNDED ITEM: 0 refills | Status: DC

## 2018-12-12 NOTE — Telephone Encounter (Signed)
Spoke with patient. Results given. Patient verbalizes understanding and would like to try testosterone cream. Rx for 1% testosterone cream, place 0.5 grams topically qd (arms, legs, lower abdomen). #30, no refills sent to [facility] on file. Follow up lab appointment scheduled for 01/12/2019 at 1:30 pm. Patient is agreeable to date and time. Encounter closed.

## 2018-12-12 NOTE — Telephone Encounter (Signed)
Left message to call Toshiyuki Fredell at [phone]. 

## 2018-12-12 NOTE — Telephone Encounter (Signed)
-----   Message from Salvadore Dom, MD sent at 12/12/2018 12:52 PM EDT ----- Please let the patient know that her TSH is normal and her testosterone levels are in the low normal range (could effect libido). Let her know that there are no FDA approved testosterone for women, but we can have a low dose compounded to use topically if she would like. If she had too much testosterone it could cause hair loss, hirsutism, deepening of the voice, clitoromegaly. We would need to check her levels one month after starting the medication to make sure she is in a normal range.  If she wants it, please call in: 1% testosterone cream, place 0.5 grams topically qd (arms, legs, lower abdomen). #30, no refills, testosterone panel one month after starting.

## 2018-12-12 NOTE — Telephone Encounter (Signed)
Sharyn Lull from [facility] is calling regarding a prescription for a testosterone compound cream. Sharyn Lull said that their pharmacy does not do compounds and the prescription will need to be sent to another pharmacy. She stated that someone can call her back or Domingo Madeira, the pharmacist at [phone]

## 2018-12-12 NOTE — Telephone Encounter (Signed)
Spoke with patient. [facility] does not fill compounded prescriptions, will need to send RX to another pharmacy. Reviewed options, will fax testosterone RX to [facility]. Patient verbalizes understanding and is agreeable.  Call returned to [facility], spoke with Sharyn Lull, Rx cancelled for compounded testosterone cream.   Testosterone compounded cream RX faxed to [facility].   Encounter closed.

## 2019-01-12 ENCOUNTER — Other Ambulatory Visit: Payer: Self-pay

## 2019-01-12 ENCOUNTER — Other Ambulatory Visit: Payer: Self-pay | Admitting: *Deleted

## 2019-01-12 ENCOUNTER — Other Ambulatory Visit (INDEPENDENT_AMBULATORY_CARE_PROVIDER_SITE_OTHER): Payer: BC Managed Care – PPO

## 2019-01-12 DIAGNOSIS — R6882 Decreased libido: Secondary | ICD-10-CM

## 2019-01-12 NOTE — Addendum Note (Signed)
Addended by: Burnice Logan on: 01/12/2019 01:26 PM   Modules accepted: Orders

## 2019-01-12 NOTE — Addendum Note (Signed)
Addended by: Burnice Logan on: 01/12/2019 10:39 AM   Modules accepted: Orders

## 2019-01-15 ENCOUNTER — Telehealth: Payer: Self-pay

## 2019-01-15 LAB — TESTT+TESTF+SHBG
Sex Hormone Binding: 73.4 nmol/L (ref 17.3–125.0)
Testosterone, Free: 1.3 pg/mL (ref 0.0–4.2)
Testosterone, Total, LC/MS: 170.1 ng/dL

## 2019-01-15 NOTE — Telephone Encounter (Signed)
Patient states she hasn't noticed any change in libido with testosterone, but would like to continue for another month. Will discuss with Dr.Jertson recommended dosing and call her back--will need 4 week lab appt. Routed to provider

## 2019-01-15 NOTE — Telephone Encounter (Signed)
-----   Message from Salvadore Dom, MD sent at 01/15/2019  3:08 PM EDT ----- Please let the patient know that her total testosterone level is too high. Please confirm that she has the correct dose and is using it as prescribed. She how she is feeling with the testosterone. If she wants to stay on it we will need to make adjustments to her dose and recheck her level in a month.

## 2019-01-15 NOTE — Telephone Encounter (Signed)
Spoke with patient. Advised of high testosterone level. Confirmed with patient dose of 0.5gm qd--her measuring tool is in milliliters--she's using 0.1ml qd

## 2019-01-16 NOTE — Telephone Encounter (Signed)
Libido is a complicated issue, but if her low libido is from low testosterone levels I would anticipate that her libido would be better with her testosterone level. If she wants to continue it for another month, have her go down to 2 ml a day. Then she needs a repeat testosterone panel

## 2019-01-19 NOTE — Telephone Encounter (Signed)
Called patient to discuss Dr.Jertson's recommendations. Patient states she has decided to stop Testosterone. She will call back if she wants to restart.

## 2019-03-16 ENCOUNTER — Ambulatory Visit: Payer: BLUE CROSS/BLUE SHIELD | Admitting: Obstetrics and Gynecology

## 2019-03-27 ENCOUNTER — Ambulatory Visit: Payer: BLUE CROSS/BLUE SHIELD | Admitting: Obstetrics & Gynecology

## 2021-03-23 ENCOUNTER — Other Ambulatory Visit: Payer: Self-pay | Admitting: Obstetrics and Gynecology

## 2021-03-23 DIAGNOSIS — Z1231 Encounter for screening mammogram for malignant neoplasm of breast: Secondary | ICD-10-CM

## 2021-06-22 ENCOUNTER — Ambulatory Visit: Payer: BC Managed Care – PPO | Admitting: Obstetrics and Gynecology

## 2021-06-22 DIAGNOSIS — Z1231 Encounter for screening mammogram for malignant neoplasm of breast: Secondary | ICD-10-CM

## 2021-08-08 NOTE — Progress Notes (Deleted)
56 y.o. G53P2002 Married White or Caucasian Not Hispanic or Latino female here for annual exam.      Patient's last menstrual period was 09/28/2012.          Sexually active: {yes no:314532}  The current method of family planning is {contraception:315051}.    Exercising: {yes no:314532}  {types:19826} Smoker:  {YES P5382123  Health Maintenance: Pap:  12/28/16 Neg. HR HPV:neg  History of abnormal Pap:  no MMG:  04/18/18 density C Bi-rads 1 neg is scheduled for one  08/15/21 BMD:   none  Colonoscopy: never  TDaP:  2011  Gardasil: n/a   reports that she has quit smoking. Her smoking use included cigarettes. She has never used smokeless tobacco. She reports that she does not drink alcohol and does not use drugs.  Past Medical History:  Diagnosis Date   Abnormal uterine bleeding (AUB)    Anxiety    Resolved after death of mother   Depression    Resolved after death of mother   IUD (intrauterine device) in place 07/12/2008   Expires 07/12/2013   IUD (intrauterine device) in place 07/14/2013   Exp. 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   Wears contact lenses    Wears glasses     Past Surgical History:  Procedure Laterality Date   CESAREAN SECTION     x 2   DILATATION & CURETTAGE/HYSTEROSCOPY WITH MYOSURE N/A 11/25/2018   Procedure: DILATATION & CURETTAGE/HYSTEROSCOPY;  Surgeon: Salvadore Dom, MD;  Location: [facility];  Service: Gynecology;  Laterality: N/A;   FRACTURE SURGERY Right    ARM put to sleep to reset fracture   IUD REMOVAL  06/2018   WISDOM TOOTH EXTRACTION      Current Outpatient Medications  Medication Sig Dispense Refill   estradiol (ESTRACE) 0.1 MG/GM vaginal cream      medroxyPROGESTERone (PROVERA) 5 MG tablet Take one tablet a day for 5 days every other month until you don't bleed at all for 6 months. Start May 1 15 tablet 1   Multiple Vitamin (MULTIVITAMIN)  tablet Take 1 tablet by mouth daily.     NONFORMULARY OR COMPOUNDED ITEM 1% testosterone cream, place 0.5 grams topically qd (arms, legs, lower abdomen). #30, no refills 1 each 0   OVER THE COUNTER MEDICATION Arbonne Protein Powder Shakes     OVER THE COUNTER MEDICATION Detox Green Tea     OVER THE COUNTER MEDICATION Pre and Probiotic Drink     SUMAtriptan (IMITREX) 100 MG tablet Take 1 tablet by mouth daily as needed for migraine.      No current facility-administered medications for this visit.    Family History  Problem Relation Age of Onset   Diabetes Paternal Grandfather    Hypertension Paternal Grandfather    Kidney cancer Paternal Grandfather    Stroke Paternal Grandfather    Breast cancer Mother    COPD Mother    Hypertension Paternal Grandmother    Hypertension Father     Review of Systems  Exam:   LMP 09/28/2012 Comment: some spotting 10-12-2018  Weight change: @WEIGHTCHANGE @ Height:      Ht Readings from Last 3 Encounters:  11/25/18 5\' 4"  (1.626 m)  11/20/18 5\' 4"  (1.626 m)  10/23/18 5' 3.75" (1.619 m)    General appearance: alert, cooperative and appears stated age Head: Normocephalic, without obvious abnormality, atraumatic Neck: no adenopathy, supple, symmetrical, trachea midline  and thyroid {CHL AMB PHY EX THYROID NORM [phone]::"normal to inspection and palpation"} Lungs: clear to auscultation bilaterally Cardiovascular: regular rate and rhythm Breasts: {Exam; breast:13139::"normal appearance, no masses or tenderness"} Abdomen: soft, non-tender; non distended,  no masses,  no organomegaly Extremities: extremities normal, atraumatic, no cyanosis or edema Skin: Skin color, texture, turgor normal. No rashes or lesions Lymph nodes: Cervical, supraclavicular, and axillary nodes normal. No abnormal inguinal nodes palpated Neurologic: Grossly normal   Pelvic: External genitalia:  no lesions              Urethra:  normal appearing urethra with no  masses, tenderness or lesions              Bartholins and Skenes: normal                 Vagina: normal appearing vagina with normal color and discharge, no lesions              Cervix: {CHL AMB PHY EX CERVIX NORM DEFAULT:[phone]::"no lesions"}               Bimanual Exam:  Uterus:  {CHL AMB PHY EX UTERUS NORM DEFAULT:[phone]::"normal size, contour, position, consistency, mobility, non-tender"}              Adnexa: {CHL AMB PHY EX ADNEXA NO MASS DEFAULT:[phone]::"no mass, fullness, tenderness"}               Rectovaginal: Confirms               Anus:  normal sphincter tone, no lesions  *** chaperoned for the exam.  A:  Well Woman with normal exam  P:

## 2021-08-15 ENCOUNTER — Ambulatory Visit: Payer: BC Managed Care – PPO | Admitting: Obstetrics and Gynecology

## 2021-08-16 NOTE — Progress Notes (Signed)
56 y.o. G29P2002 Married White or Caucasian Not Hispanic or Latino female here for annual exam.  She is having some vaginal dryness. She is having pain with sex, even with lubrication. Often not able to have intercourse.  No vaginal bleeding.  No bowel or bladder c/o.     Patient's last menstrual period was 09/28/2012.          Sexually active: No.  The current method of family planning is post menopausal status.    Exercising: No.  The patient does not participate in regular exercise at present. Smoker:  no  Health Maintenance: Pap:  12/28/16 Neg. HR HPV:neg              10/02/13 Neg  History of abnormal Pap:  no MMG:  04/18/18 density C Bi-rads 1 neg Has scheduled for 08/23/21 BMD:   never  Colonoscopy: none  Cologuard: 3 years ago, negative.  TDaP:  UTD with primary Gardasil: n/a   reports that she has quit smoking. Her smoking use included cigarettes. She has never used smokeless tobacco. She reports that she does not drink alcohol and does not use drugs. Works from home, has a Journalist, newspaper. Kids are 19 and 21. Both in college. Daughter will graduate in May, son is a Administrator, arts.   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     SUMAtriptan (IMITREX) 100 MG tablet Take 1 tablet by mouth daily as needed for migraine.      topiramate (TOPAMAX) 100 MG tablet      No current facility-administered medications for this visit.    Family History  Problem Relation Age of Onset   Diabetes Paternal Grandfather    Hypertension Paternal Grandfather    Kidney cancer Paternal Grandfather    Stroke Paternal Grandfather    Breast cancer Mother    COPD Mother    Hypertension Paternal Grandmother    Hypertension Father   Mom died of COPD at 8, got breast cancer in her 98's.   Review of Systems  Genitourinary:  Positive for vaginal pain.  All other systems reviewed and are negative.  Exam:   BP (!) 140/92    Pulse 93    Ht 5\' 4"  (1.626 m)    Wt 140 lb (63.5 kg)    LMP 09/28/2012 Comment: some spotting 10-12-2018   SpO2 99%    BMI 24.03 kg/m   Weight change: @WEIGHTCHANGE @ Height:   Height: 5\' 4"  (162.6 cm)  Ht Readings from Last 3 Encounters:  08/22/21 5\' 4"  (1.626 m)  11/25/18 5\' 4"  (1.626 m)  11/20/18 5\' 4"  (1.626 m)    General appearance: alert, cooperative and appears stated age Head: Normocephalic, without obvious abnormality, atraumatic Neck: no adenopathy, supple, symmetrical, trachea midline and thyroid normal to inspection and palpation Lungs: clear to auscultation bilaterally Cardiovascular: regular rate and rhythm Breasts: normal appearance, no masses or tenderness Abdomen: soft, non-tender; non distended,  no masses,  no organomegaly Extremities: extremities normal, atraumatic, no cyanosis or edema Skin: Skin color, texture, turgor normal. No rashes or lesions Lymph nodes: Cervical, supraclavicular, and axillary nodes normal. No abnormal inguinal nodes palpated Neurologic: Grossly normal   Pelvic: External genitalia:  no lesions              Urethra:  normal appearing urethra with no masses, tenderness or lesions               Bartholins and Skenes: normal                 Vagina: atrophic appearing vagina with normal color and discharge, no lesions              Cervix: no lesions               Bimanual Exam:  Uterus:  normal size, contour, position, consistency, mobility, non-tender              Adnexa: no mass, fullness, tenderness               Rectovaginal: Confirms               Anus:  normal sphincter tone, no lesions  Gae Dry chaperoned for the exam.  1. Well woman exam Discussed breast self exam Discussed calcium and vit D intake Mammogram tomorrow Labs with primary  2. Screening for cervical cancer - Cytology - PAP  3. Vaginal atrophy Discussed the option of vaginal estrogen, reviewed risks - Estradiol 10 MCG TABS vaginal tablet; Use one tablet vaginally qhs x 1 week, then change to 2 x a week at hs.  Dispense: 24 tablet; Refill: 4  4. Dyspareunia, female Discussed lubrication, she should control rate and depth of penetration - Estradiol 10 MCG TABS vaginal tablet; Use one tablet vaginally qhs x 1 week, then change to 2 x a week at hs.  Dispense: 24 tablet; Refill: 4  5. Colon cancer screening Reviewed options. Desires cologuard - Cologuard

## 2021-08-22 ENCOUNTER — Ambulatory Visit (INDEPENDENT_AMBULATORY_CARE_PROVIDER_SITE_OTHER): Payer: BC Managed Care – PPO | Admitting: Obstetrics and Gynecology

## 2021-08-22 ENCOUNTER — Other Ambulatory Visit: Payer: Self-pay

## 2021-08-22 ENCOUNTER — Encounter: Payer: Self-pay | Admitting: Obstetrics and Gynecology

## 2021-08-22 ENCOUNTER — Other Ambulatory Visit (HOSPITAL_COMMUNITY)
Admission: RE | Admit: 2021-08-22 | Discharge: 2021-08-22 | Disposition: A | Discharge status: Home / Self Care | Payer: BC Managed Care – PPO | Source: Ambulatory Visit | Attending: Obstetrics and Gynecology | Admitting: Obstetrics and Gynecology

## 2021-08-22 VITALS — BP 140/92 | HR 93 | Ht 64.0 in | Wt 140.0 lb

## 2021-08-22 DIAGNOSIS — Z124 Encounter for screening for malignant neoplasm of cervix: Secondary | ICD-10-CM

## 2021-08-22 DIAGNOSIS — Z1211 Encounter for screening for malignant neoplasm of colon: Secondary | ICD-10-CM

## 2021-08-22 DIAGNOSIS — N941 Unspecified dyspareunia: Secondary | ICD-10-CM | POA: Diagnosis not present

## 2021-08-22 DIAGNOSIS — N952 Postmenopausal atrophic vaginitis: Secondary | ICD-10-CM

## 2021-08-22 DIAGNOSIS — Z01419 Encounter for gynecological examination (general) (routine) without abnormal findings: Secondary | ICD-10-CM | POA: Diagnosis not present

## 2021-08-22 MED ORDER — ESTRADIOL 10 MCG VA TABS: ORAL_TABLET | VAGINAL | 4 refills | Status: DC

## 2021-08-22 NOTE — Patient Instructions (Addendum)

## 2021-08-23 ENCOUNTER — Ambulatory Visit
Admission: RE | Admit: 2021-08-23 | Discharge: 2021-08-23 | Disposition: A | Discharge status: Home / Self Care | Payer: BC Managed Care – PPO | Source: Ambulatory Visit | Attending: Obstetrics and Gynecology | Admitting: Obstetrics and Gynecology

## 2021-08-23 DIAGNOSIS — Z1231 Encounter for screening mammogram for malignant neoplasm of breast: Secondary | ICD-10-CM

## 2021-08-23 LAB — CYTOLOGY - PAP
Comment: NEGATIVE
Diagnosis: NEGATIVE
High risk HPV: NEGATIVE

## 2021-09-01 ENCOUNTER — Telehealth: Payer: Self-pay

## 2021-09-01 NOTE — Telephone Encounter (Signed)
Patient calling to report that the estradiol vag tabs recently Rx'd to her on 08/22/21 are too expensive whether through insurance or with GoodRx. Pt reports she would appreciate an alternative if possible but would also be ok without taking anything. Please advise.

## 2021-09-05 MED ORDER — ESTRADIOL 0.1 MG/GM VA CREA: TOPICAL_CREAM | VAGINAL | 1 refills | Status: DC

## 2021-09-05 NOTE — Telephone Encounter (Signed)
Mychart msg sent informing pt

## 2021-09-05 NOTE — Telephone Encounter (Signed)
Estrace cream sent

## 2021-09-14 ENCOUNTER — Telehealth: Payer: Self-pay

## 2021-09-14 DIAGNOSIS — R195 Other fecal abnormalities: Secondary | ICD-10-CM

## 2021-09-14 LAB — COLOGUARD: COLOGUARD: POSITIVE — AB

## 2021-09-14 NOTE — Telephone Encounter (Signed)
Referral order placed. Left message for patient to call. ?

## 2021-09-14 NOTE — Telephone Encounter (Signed)
Salvadore Dom, MD  P [facility] Triage ?Please let the patient know that her Cologuard is + and that she needs a colonoscopy. Please place a referral to Dr Laurier Nancy at [facility]  ?

## 2021-09-15 NOTE — Telephone Encounter (Signed)
Patient called back, informed with below, number given to call as well. ?

## 2021-09-19 NOTE — Telephone Encounter (Signed)
Pre op appt scheduled 09/20/21 and Colonoscopy with Dr. Tarri Glenn scheduled 10/12/21. ?

## 2021-09-20 ENCOUNTER — Other Ambulatory Visit: Payer: Self-pay

## 2021-09-20 ENCOUNTER — Ambulatory Visit (AMBULATORY_SURGERY_CENTER): Payer: Self-pay

## 2021-09-20 VITALS — Ht 64.0 in | Wt 138.0 lb

## 2021-09-20 DIAGNOSIS — Z1211 Encounter for screening for malignant neoplasm of colon: Secondary | ICD-10-CM

## 2021-09-20 MED ORDER — NA SULFATE-K SULFATE-MG SULF 17.5-3.13-1.6 GM/177ML PO SOLN: 1.0000 | Freq: Once | ORAL | 0 refills | Status: AC

## 2021-09-20 NOTE — Progress Notes (Signed)
Denies allergies to eggs or soy products. Denies complication of anesthesia or sedation. Denies use of weight loss medication. Denies use of O2.  ? ?Emmi instructions given for colonoscopy. ? ?Patient is extremely anxious about her colonoscopy. Patient does not want a female in the room while she has a colonoscopy. Patient was re-assured that every effort was made to respect each patients privacy and dignity during a procedure. I also checked the schedule and Tabitha is scheduled to work with Dr. Tarri Glenn on 3/30. Patient was informed and somewhat relieved.  ?

## 2021-09-26 ENCOUNTER — Telehealth: Payer: Self-pay | Admitting: Gastroenterology

## 2021-09-26 DIAGNOSIS — Z1211 Encounter for screening for malignant neoplasm of colon: Secondary | ICD-10-CM

## 2021-09-26 MED ORDER — NA SULFATE-K SULFATE-MG SULF 17.5-3.13-1.6 GM/177ML PO SOLN: 1.0000 | Freq: Once | ORAL | 0 refills | Status: AC

## 2021-09-26 NOTE — Telephone Encounter (Signed)
Suprep sent to [facility] per pt request  ?

## 2021-09-26 NOTE — Telephone Encounter (Signed)
Inbound call from patient requesting suprep be sent to [facility] in [place], [place] ?

## 2021-10-04 ENCOUNTER — Encounter: Payer: BC Managed Care – PPO | Admitting: Gastroenterology

## 2021-10-10 ENCOUNTER — Encounter: Payer: Self-pay | Admitting: Physical Therapy

## 2021-10-12 ENCOUNTER — Ambulatory Visit (AMBULATORY_SURGERY_CENTER): Payer: BC Managed Care – PPO | Admitting: Gastroenterology

## 2021-10-12 ENCOUNTER — Encounter: Payer: Self-pay | Admitting: Gastroenterology

## 2021-10-12 VITALS — BP 132/73 | HR 72 | Temp 96.9°F | Resp 23 | Ht 64.0 in | Wt 138.0 lb

## 2021-10-12 DIAGNOSIS — D124 Benign neoplasm of descending colon: Secondary | ICD-10-CM

## 2021-10-12 DIAGNOSIS — D120 Benign neoplasm of cecum: Secondary | ICD-10-CM

## 2021-10-12 DIAGNOSIS — R195 Other fecal abnormalities: Secondary | ICD-10-CM | POA: Diagnosis present

## 2021-10-12 DIAGNOSIS — K635 Polyp of colon: Secondary | ICD-10-CM

## 2021-10-12 DIAGNOSIS — D122 Benign neoplasm of ascending colon: Secondary | ICD-10-CM

## 2021-10-12 DIAGNOSIS — K648 Other hemorrhoids: Secondary | ICD-10-CM

## 2021-10-12 DIAGNOSIS — D123 Benign neoplasm of transverse colon: Secondary | ICD-10-CM

## 2021-10-12 MED ORDER — SODIUM CHLORIDE 0.9 % IV SOLN: 500.0000 mL | INTRAVENOUS | Status: DC

## 2021-10-12 NOTE — Progress Notes (Signed)
To Pacu, VSS. Report to [email or web address] 

## 2021-10-12 NOTE — Progress Notes (Signed)
Called to room to assist during endoscopic procedure.  Patient ID and intended procedure confirmed with present staff. Received instructions for my participation in the procedure from the performing physician.  

## 2021-10-12 NOTE — Patient Instructions (Signed)
YOU HAD AN ENDOSCOPIC PROCEDURE TODAY AT THE [facility]:   Refer to the procedure report that was given to you for any specific questions about what was found during the examination.  If the procedure report does not answer your questions, please call your gastroenterologist to clarify.  If you requested that your care partner not be given the details of your procedure findings, then the procedure report has been included in a sealed envelope for you to review at your convenience later.  YOU SHOULD EXPECT: Some feelings of bloating in the abdomen. Passage of more gas than usual.  Walking can help get rid of the air that was put into your GI tract during the procedure and reduce the bloating. If you had a lower endoscopy (such as a colonoscopy or flexible sigmoidoscopy) you may notice spotting of blood in your stool or on the toilet paper. If you underwent a bowel prep for your procedure, you may not have a normal bowel movement for a few days.  Please Note:  You might notice some irritation and congestion in your nose or some drainage.  This is from the oxygen used during your procedure.  There is no need for concern and it should clear up in a day or so.  SYMPTOMS TO REPORT IMMEDIATELY:   Following lower endoscopy (colonoscopy or flexible sigmoidoscopy):  Excessive amounts of blood in the stool  Significant tenderness or worsening of abdominal pains  Swelling of the abdomen that is new, acute  Fever of 100F or higher  For urgent or emergent issues, a gastroenterologist can be reached at any hour by calling [phone]. Do not use MyChart messaging for urgent concerns.    DIET:  We do recommend a small meal at first, but then you may proceed to your regular diet.  Drink plenty of fluids but you should avoid alcoholic beverages for 24 hours.  ACTIVITY:  You should plan to take it easy for the rest of today and you should NOT DRIVE or use heavy machinery until tomorrow (because  of the sedation medicines used during the test).    FOLLOW UP: Our staff will call the number listed on your records 48-72 hours following your procedure to check on you and address any questions or concerns that you may have regarding the information given to you following your procedure. If we do not reach you, we will leave a message.  We will attempt to reach you two times.  During this call, we will ask if you have developed any symptoms of COVID 19. If you develop any symptoms (ie: fever, flu-like symptoms, shortness of breath, cough etc.) before then, please call [phone].  If you test positive for Covid 19 in the 2 weeks post procedure, please call and report this information to us.    If any biopsies were taken you will be contacted by phone or by letter within the next 1-3 weeks.  Please call us at [phone] if you have not heard about the biopsies in 3 weeks.    SIGNATURES/CONFIDENTIALITY: You and/or your care partner have signed paperwork which will be entered into your electronic medical record.  These signatures attest to the fact that that the information above on your After Visit Summary has been reviewed and is understood.  Full responsibility of the confidentiality of this discharge information lies with you and/or your care-partner. 

## 2021-10-12 NOTE — Op Note (Addendum)
[place] ?Patient Name: Mary Bolton ?Procedure Date: 10/12/2021 2:50 PM ?MRN: [id number] ?Endoscopist: Thornton Park MD, MD ?Age: 56 ?Referring MD:  ?Date of Birth: 1965-09-07 ?Gender: Female ?Account #: [id number] ?Procedure:                Colonoscopy ?Indications:              Positive Cologuard test ?                          [place] Grandmother with colon cancer ?Medicines:                Monitored Anesthesia Care ?Procedure:                Pre-Anesthesia Assessment: ?                          - Prior to the procedure, a History and Physical  ?                          was performed, and patient medications and  ?                          allergies were reviewed. The patient's tolerance of  ?                          previous anesthesia was also reviewed. The risks  ?                          and benefits of the procedure and the sedation  ?                          options and risks were discussed with the patient.  ?                          All questions were answered, and informed consent  ?                          was obtained. Prior Anticoagulants: The patient has  ?                          taken no previous anticoagulant or antiplatelet  ?                          agents. ASA Grade Assessment: II - A patient with  ?                          mild systemic disease. After reviewing the risks  ?                          and benefits, the patient was deemed in  ?                          satisfactory condition to undergo the procedure. ?  After obtaining informed consent, the colonoscope  ?                          was passed under direct vision. Throughout the  ?                          procedure, the patient's blood pressure, pulse, and  ?                          oxygen saturations were monitored continuously. The  ?                          Olympus CF-HQ190L ([id number]) Colonoscope was  ?                          introduced through the anus and  advanced to the 3  ?                          cm into the ileum. A second forward view of the  ?                          right colon was performed. The colonoscopy was  ?                          performed without difficulty. The patient tolerated  ?                          the procedure well. The quality of the bowel  ?                          preparation was good. The terminal ileum, ileocecal  ?                          valve, appendiceal orifice, and rectum were  ?                          photographed. ?Scope In: 3:06:53 PM ?Scope Out: 3:27:10 PM ?Scope Withdrawal Time: 0 hours 16 minutes 43 seconds  ?Total Procedure Duration: 0 hours 20 minutes 17 seconds  ?Findings:                 The perianal and digital rectal examinations were  ?                          normal except for hemorrhoids. ?                          A 4 mm polyp was found in the descending colon. The  ?                          polyp was sessile. The polyp was removed with a  ?                          cold snare. Resection and retrieval were complete.  ?  Estimated blood loss was minimal. ?                          A 3 mm polyp was found in the transverse colon. The  ?                          polyp was sessile. The polyp was removed with a  ?                          cold snare. Resection and retrieval were complete.  ?                          Estimated blood loss was minimal. ?                          Two sessile polyps were found in the ascending  ?                          colon. The polyps were 3 to 6 mm in size. These  ?                          polyps were removed with a cold snare. Resection  ?                          and retrieval were complete. Estimated blood loss  ?                          was minimal. ?                          A 2 mm polyp was found in the cecum. The polyp was  ?                          sessile. The polyp was removed with a cold snare.  ?                          Resection and  retrieval were complete. Estimated  ?                          blood loss was minimal. ?                          The exam was otherwise without abnormality on  ?                          direct and retroflexion views except for  ?                          hemorrhoids. ?Complications:            No immediate complications. ?Estimated Blood Loss:     Estimated blood loss was minimal. ?Impression:               - Internal and external hemorrhoids. ?                          -  One 4 mm polyp in the descending colon, removed  ?                          with a cold snare. Resected and retrieved. ?                          - One 3 mm polyp in the transverse colon, removed  ?                          with a cold snare. Resected and retrieved. ?                          - Two 3 to 6 mm polyps in the ascending colon,  ?                          removed with a cold snare. Resected and retrieved. ?                          - One 2 mm polyp in the cecum, removed with a cold  ?                          snare. Resected and retrieved. ?                          - The examination was otherwise normal on direct  ?                          and retroflexion views. ?Recommendation:           - Patient has a contact number available for  ?                          emergencies. The signs and symptoms of potential  ?                          delayed complications were discussed with the  ?                          patient. Return to normal activities tomorrow.  ?                          Written discharge instructions were provided to the  ?                          patient. ?                          - Resume previous diet. ?                          - Continue present medications. ?                          - Await pathology results. ?                          -  Repeat colonoscopy in 3 years for surveillance if  ?                          at least 3 polyps are adenomas. ?                          - Emerging evidence supports eating a  diet of  ?                          fruits, vegetables, grains, calcium, and yogurt  ?                          while reducing red meat and alcohol may reduce the  ?                          risk of colon cancer. ?                          - Thank you for allowing me to be involved in your  ?                          colon cancer prevention. ?Thornton Park MD, MD ?10/12/2021 3:36:17 PM ?This report has been signed electronically. ?

## 2021-10-12 NOTE — Progress Notes (Signed)
? ?Referring Provider: Caryl Bis, MD ?Primary Care Physician:  Caryl Bis, MD ? ?Indication for Procedure:  Cologuard + ? ? ?IMPRESSION:  ?Cologuard + ?Appropriate candidate for monitored anesthesia care ? ?PLAN: ?Colonoscopy in the [facility] today ? ? ?HPI: Mary Bolton is a 56 y.o. female presents for Cologuard +. ? ?No prior colonoscopy or colon cancer screening. ? ?No baseline GI symptoms.  ? ?No known family history of colon cancer or polyps. No family history of uterine/endometrial cancer, pancreatic cancer or gastric/stomach cancer. ? ? ?Past Medical History:  ?Diagnosis Date  ? Abnormal uterine bleeding (AUB)   ? Allergy   ? Anxiety   ? Resolved after death of mother  ? Depression   ? Resolved after death of mother  ? Migraine   ? TMJ (temporomandibular joint disorder)   ? pt denies  ? 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 Wears contact lenses   ? Wears glasses   ? ? ?Past Surgical History:  ?Procedure Laterality Date  ? CESAREAN SECTION    ? x 2  ? DILATATION & CURETTAGE/HYSTEROSCOPY WITH MYOSURE N/A 11/25/2018  ? Procedure: DILATATION & CURETTAGE/HYSTEROSCOPY;  Surgeon: Salvadore Dom, MD;  Location: [facility];  Service: Gynecology;  Laterality: N/A;  ? FRACTURE SURGERY Right   ? ARM put to sleep to reset fracture  ? IUD REMOVAL  06/2018  ? WISDOM TOOTH EXTRACTION    ? ? ?Current Outpatient Medications  ?Medication Sig Dispense Refill  ? SUMAtriptan (IMITREX) 100 MG tablet Take 1 tablet by mouth daily as needed for migraine.     ? estradiol (ESTRACE) 0.1 MG/GM vaginal cream 1 gram vaginally qhs x 1 week, then change to twice weekly (Patient not taking: Reported on 09/20/2021) 42.5 g 1  ? Multiple Vitamin (MULTIVITAMIN) tablet Take 1 tablet by mouth daily.    ? topiramate (TOPAMAX) 100 MG tablet     ? ?Current Facility-Administered Medications  ?Medication Dose Route Frequency Provider Last Rate Last Admin  ? 0.9 %  sodium chloride infusion  500 mL Intravenous Continuous [place], MD      ? ? ?Allergies as of 10/12/2021  ? (No Known Allergies)  ? ? ?Family History  ?Problem Relation Age of Onset  ? Breast cancer Mother   ? COPD Mother   ? Hypertension Father   ? Hypertension Paternal Grandmother   ? Diabetes Paternal Grandfather   ? Hypertension Paternal Grandfather   ? Kidney cancer Paternal Grandfather   ? Stroke Paternal Grandfather   ? Colon cancer Neg Hx   ? Esophageal cancer Neg Hx   ? Rectal cancer Neg Hx   ? Stomach cancer Neg Hx   ? ? ? ?Physical Exam: ?General:   Alert,  well-nourished, pleasant and cooperative in NAD ?Head:  Normocephalic and atraumatic. ?Eyes:  Sclera clear, no icterus.   Conjunctiva pink. ?Mouth:  No deformity or lesions.   ?Neck:  Supple; no masses or thyromegaly. ?Lungs:  Clear throughout to auscultation.   No wheezes. ?Heart:  Regular rate and rhythm; no murmurs. ?Abdomen:  Soft, non-tender, nondistended, normal bowel sounds, no rebound or guarding.  ?Msk:  Symmetrical. No boney deformities ?LAD: No inguinal or umbilical LAD ?Extremities:  No clubbing or edema. ?Neurologic:  Alert and  oriented x4;  grossly nonfocal ?Skin:  No obvious rash or bruise. ?Psych:  Alert and cooperative. Normal mood and affect. ? ? ? ? ?Studies/Results: ?No results found. ? ? ? ?[name], MD,  MPH ?10/12/2021, 2:47 PM ? ? ? ?  ?

## 2021-10-12 NOTE — Progress Notes (Signed)
Pt's states no medical or surgical changes since previsit or office visit. 

## 2021-10-13 NOTE — Telephone Encounter (Signed)
Good Morning. Change completed. ?

## 2021-10-16 ENCOUNTER — Telehealth: Payer: Self-pay

## 2021-10-16 NOTE — Telephone Encounter (Signed)
?  Follow up Call- ? ? ?  10/12/2021  ?  2:20 PM  ?Call back number  ?Post procedure Call Back phone  # [phone]  ?Permission to leave phone message Yes  ?  ? ?Patient questions: ? ?Do you have a fever, pain , or abdominal swelling? No. ?Pain Score  0 * ? ?Have you tolerated food without any problems? Yes.   ? ?Have you been able to return to your normal activities? Yes.   ? ?Do you have any questions about your discharge instructions: ?Diet   No. ?Medications  No. ?Follow up visit  No. ? ?Do you have questions or concerns about your Care? Yes.   ? ?Actions: ? ? ? ?* If pain score is 4 or above: ? ?No action needed, pain <4. ? ?Pt was fine from her procedure.  She did have a concern.  She was very displeased that pictures of her anus were on her MyChart.  She said "if I knew that would have happened she would never have  consented to it".  I explained that we take pictures through the colonoscopy to show what was scene in her colon.  Pt want s these pictures to be removed from her MyChart.  I told her I would report this to our Director.  ? ?[facility] and [place] do not have Epic at this time.  I will verbally report to them and also sent to [facility].   ? ? ?

## 2021-10-17 ENCOUNTER — Encounter: Payer: Self-pay | Admitting: Gastroenterology

## 2022-06-26 ENCOUNTER — Other Ambulatory Visit: Payer: Self-pay | Admitting: Obstetrics and Gynecology

## 2022-06-26 DIAGNOSIS — Z1231 Encounter for screening mammogram for malignant neoplasm of breast: Secondary | ICD-10-CM

## 2022-08-23 ENCOUNTER — Ambulatory Visit: Payer: BC Managed Care – PPO | Admitting: Obstetrics and Gynecology

## 2022-08-27 ENCOUNTER — Ambulatory Visit: Payer: BC Managed Care – PPO | Admitting: Obstetrics and Gynecology

## 2022-08-27 DIAGNOSIS — Z1231 Encounter for screening mammogram for malignant neoplasm of breast: Secondary | ICD-10-CM

## 2022-09-25 NOTE — Progress Notes (Signed)
57 y.o. G36P2002 Married White or Caucasian Not Hispanic or Latino female here for annual exam.   Was given vaginal estrogen last year, decided not to start it. Too worried about cancer.  Occasionally sexually active, painful, using a lubricant. Occasionally bleeds after sex only occurs with pain. No other bleeding.    No bowel or bladder c/o.   Patient's last menstrual period was 09/28/2012.          Sexually active: Yes.    The current method of family planning is post menopausal status.    Exercising: Yes.     Walking the [employer]  Smoker:  no  Health Maintenance: Pap:  08/22/21 WNL hr hpv neg History of abnormal Pap:  no MMG:  08/24/21 density B Bi-rads 1 neg, scheduled today.   BMD:   n/a 09/06/21 cologuard +  Colonoscopy: 10/12/21 colonoscopy polyps f/u 5 years TDaP:  utd per patient with her primary  Gardasil: n/a   reports that Yevonne Pax has quit smoking. [place] smoking use included cigarettes. Yevonne Pax has never used smokeless tobacco. Yevonne Pax reports that Yevonne Pax does not drink alcohol and does not use drugs. Works from home, has a Journalist, newspaper.   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     SUMAtriptan (IMITREX) 100 MG tablet Take 1 tablet by mouth daily as needed for migraine.      topiramate (TOPAMAX) 100 MG tablet      No current facility-administered medications for this visit.    Family History  Problem Relation Age of Onset   Breast cancer Mother    COPD Mother    Hypertension Father    Hypertension Paternal Grandmother    Diabetes Paternal Grandfather    Hypertension Paternal Grandfather    Kidney cancer Paternal Grandfather    Stroke Paternal Grandfather    Colon cancer Neg Hx    Esophageal cancer Neg Hx    Rectal cancer Neg Hx    Stomach cancer Neg Hx   Mom died at 50 of COPD. She was told she had breast cancer, already toward the end of her life.    Review of Systems  All other systems reviewed and are negative.   Exam:   BP 128/72   Pulse 100   Ht 5\' 4"  (1.626 m)   Wt 141 lb (64 kg)   LMP 09/28/2012 Comment: some spotting 10-12-2018  SpO2 100%   BMI 24.20 kg/m   Weight change: @WEIGHTCHANGE @  Height:   Height: 5\' 4"  (162.6 cm)  Ht Readings from Last 3 Encounters:  10/03/22 5\' 4"  (1.626 m)  10/12/21 5\' 4"  (1.626 m)  09/20/21 5\' 4"  (1.626 m)    General appearance: alert, cooperative and appears stated age Head: Normocephalic, without obvious abnormality, atraumatic Neck: no adenopathy, supple, symmetrical, trachea midline and thyroid normal to inspection and palpation Lungs: clear to auscultation bilaterally Cardiovascular: regular rate and rhythm Breasts: normal appearance, no masses or tenderness Abdomen: soft, non-tender; non distended,  no masses,  no organomegaly Extremities: extremities normal, atraumatic, no cyanosis or edema Skin: Skin color, texture, turgor normal. No rashes or lesions Lymph nodes: Cervical, supraclavicular, and axillary nodes normal. No abnormal inguinal nodes palpated Neurologic: Grossly normal   Pelvic:  External genitalia:  no lesions              Urethra:  normal appearing urethra with no masses, tenderness or lesions              Bartholins and Skenes: normal                 Vagina: atrophic appearing vagina with normal color and discharge, no lesions              Cervix: no lesions               Bimanual Exam:  Uterus:  normal size, contour, position, consistency, mobility, non-tender              Adnexa: no mass, fullness, tenderness               Rectovaginal: Confirms               Anus:  normal sphincter tone, no lesions  Gae Dry, CMA chaperoned for the exam.  1. Well woman exam Discussed breast self exam Discussed calcium and vit D intake Labs with primary Mammogram today Colonoscopy UTD  2. Vaginal atrophy We discussed the potentially very small risks of vaginal estrogen - estradiol (ESTRACE) 0.1 MG/GM vaginal cream; 1 gram vaginally twice weekly  Dispense: 42.5 g; Refill: 2  3. Dyspareunia, female -continue to use lubrication - estradiol (ESTRACE) 0.1 MG/GM vaginal cream; 1 gram vaginally twice weekly  Dispense: 42.5 g; Refill: 2

## 2022-10-03 ENCOUNTER — Ambulatory Visit
Admission: RE | Admit: 2022-10-03 | Discharge: 2022-10-03 | Disposition: A | Discharge status: Home / Self Care | Payer: BC Managed Care – PPO | Source: Ambulatory Visit | Attending: Obstetrics and Gynecology | Admitting: Obstetrics and Gynecology

## 2022-10-03 ENCOUNTER — Encounter: Payer: Self-pay | Admitting: Obstetrics and Gynecology

## 2022-10-03 ENCOUNTER — Ambulatory Visit (INDEPENDENT_AMBULATORY_CARE_PROVIDER_SITE_OTHER): Payer: BC Managed Care – PPO | Admitting: Obstetrics and Gynecology

## 2022-10-03 VITALS — BP 128/72 | HR 100 | Ht 64.0 in | Wt 141.0 lb

## 2022-10-03 DIAGNOSIS — Z01419 Encounter for gynecological examination (general) (routine) without abnormal findings: Secondary | ICD-10-CM | POA: Diagnosis not present

## 2022-10-03 DIAGNOSIS — Z1231 Encounter for screening mammogram for malignant neoplasm of breast: Secondary | ICD-10-CM

## 2022-10-03 DIAGNOSIS — N941 Unspecified dyspareunia: Secondary | ICD-10-CM

## 2022-10-03 DIAGNOSIS — N952 Postmenopausal atrophic vaginitis: Secondary | ICD-10-CM | POA: Diagnosis not present

## 2022-10-03 MED ORDER — ESTRADIOL 0.1 MG/GM VA CREA: TOPICAL_CREAM | VAGINAL | 2 refills | Status: DC

## 2022-10-03 NOTE — Patient Instructions (Signed)

## 2023-08-20 ENCOUNTER — Telehealth: Payer: Self-pay | Admitting: *Deleted

## 2023-08-20 NOTE — Telephone Encounter (Signed)
 Call returned to patient. Patient has OV scheduled for 08/21/23 for UTI symptoms.  Home UTI testing positive for leukocytes, neg nitrates.   Urine now clear, no blood in urine since Thursday. Urinary frequency and urgency back to her normal.Drinks a lot of fluids.  Hx of vaginal atrophy, has not used prescribed estradiol  vaginal cream. Intercourse last Wednesday, symptoms started after. Denies fever/chills, flank pain.   Recommended patient keep OV as scheduled for further evaluation. Will Send to TW to review and our office will f/u if any additional recommendations.

## 2023-08-20 NOTE — Telephone Encounter (Signed)
Yes, agree with keeping appt for tomorrow. Thanks.

## 2023-08-21 ENCOUNTER — Encounter: Payer: Self-pay | Admitting: Nurse Practitioner

## 2023-08-21 ENCOUNTER — Ambulatory Visit: Payer: BC Managed Care – PPO | Admitting: Nurse Practitioner

## 2023-08-21 VITALS — BP 124/76 | HR 78 | Temp 97.9°F | Wt 143.0 lb

## 2023-08-21 DIAGNOSIS — N941 Unspecified dyspareunia: Secondary | ICD-10-CM | POA: Diagnosis not present

## 2023-08-21 DIAGNOSIS — R319 Hematuria, unspecified: Secondary | ICD-10-CM

## 2023-08-21 DIAGNOSIS — N952 Postmenopausal atrophic vaginitis: Secondary | ICD-10-CM | POA: Diagnosis not present

## 2023-08-21 LAB — URINALYSIS, COMPLETE W/RFL CULTURE
Bacteria, UA: NONE SEEN /[HPF]
Bilirubin Urine: NEGATIVE
Glucose, UA: NEGATIVE
Hgb urine dipstick: NEGATIVE
Hyaline Cast: NONE SEEN /[LPF]
Ketones, ur: NEGATIVE
Leukocyte Esterase: NEGATIVE
Nitrites, Initial: NEGATIVE
Protein, ur: NEGATIVE
RBC / HPF: NONE SEEN /[HPF] (ref 0–2)
Specific Gravity, Urine: 1.004 (ref 1.001–1.035)
WBC, UA: NONE SEEN /[HPF] (ref 0–5)
pH: 5 (ref 5.0–8.0)

## 2023-08-21 LAB — NO CULTURE INDICATED

## 2023-08-21 NOTE — Progress Notes (Signed)
   Acute Office Visit  Subjective:    Patient ID: Mary Bolton, female    DOB: [birth year], 58 y.o.   MRN: [id number]   HPI 58 y.o. presents today for urinary frequency and urgency x 1 week. Episode of hematuria that lasted a few hours. Symptoms have improved. Had intercourse for the first time in a while before symptoms started. Episode of bleeding occurred a couple of days later. Unsure if vaginal or urinary but thinks urinary. Prescribed vaginal estrogen for dryness and painful intercourse. Never started. Sometimes unable to have intercourse due to pain. Denies vaginal itching, discharge or odor.   Patient's last menstrual period was 09/28/2012.    Review of Systems  Constitutional: Negative.   Genitourinary:  Positive for dyspareunia, frequency, hematuria (x1 episode) and urgency. Negative for difficulty urinating, dysuria, flank pain, genital sores, vaginal discharge and vaginal pain.       Objective:    Physical Exam Constitutional:      Appearance: Normal appearance.     BP 124/76 (BP Location: Right Arm, Patient Position: Sitting, Cuff Size: Normal)   Pulse 78   Temp 97.9 F (36.6 C) (Oral)   Wt 143 lb (64.9 kg)   LMP 09/28/2012 Comment: some spotting 10-12-2018  SpO2 98%   BMI 24.55 kg/m  Wt Readings from Last 3 Encounters:  08/21/23 143 lb (64.9 kg)  10/03/22 141 lb (64 kg)  10/12/21 138 lb (62.6 kg)        UA negative  Assessment & Plan:   Problem List Items Addressed This Visit   None Visit Diagnoses       Hematuria, unspecified type    -  Primary   Relevant Orders   Urinalysis,Complete w/RFL Culture     Dyspareunia, female         Vaginal atrophy          Plan: Negative UA. Symptoms are better. Recommend starting vaginal estrogen. Will use nightly x 2 weeks, then decrease to twice weekly.   Return if symptoms worsen or fail to improve.    Annabella DELENA Shutter DNP, 2:24 PM 08/21/2023

## 2023-10-21 ENCOUNTER — Other Ambulatory Visit: Payer: Self-pay | Admitting: Obstetrics and Gynecology

## 2023-10-21 DIAGNOSIS — Z0000 Encounter for general adult medical examination without abnormal findings: Secondary | ICD-10-CM

## 2023-10-26 IMAGING — MG MM DIGITAL SCREENING BILAT W/ TOMO AND CAD
6 of 10 series · 6 of 30 positions shown · non-contrast
Comparison: None.

CLINICAL DATA: Screening.

EXAM:
DIGITAL SCREENING BILATERAL MAMMOGRAM WITH TOMOSYNTHESIS AND CAD
TECHNIQUE: Bilateral screening digital craniocaudal and mediolateral oblique
mammograms were obtained. Bilateral screening digital breast
tomosynthesis was performed. The images were evaluated with
computer-aided detection.

[R MLO synth-2D]
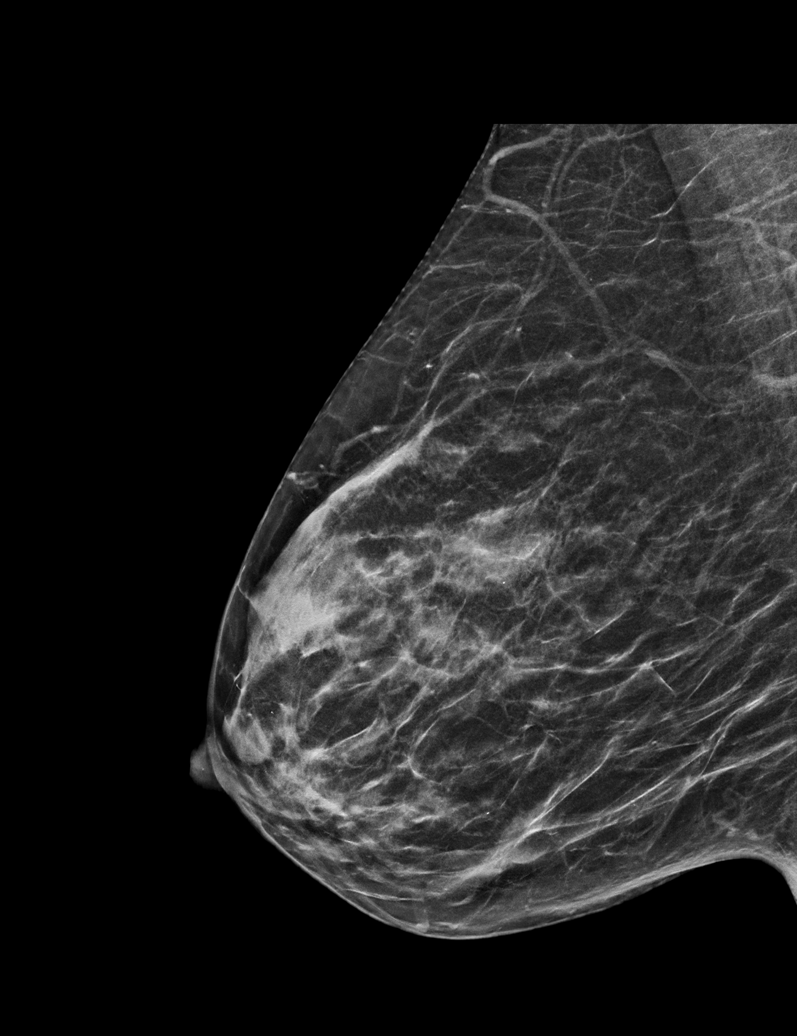

[L MLO synth-2D]
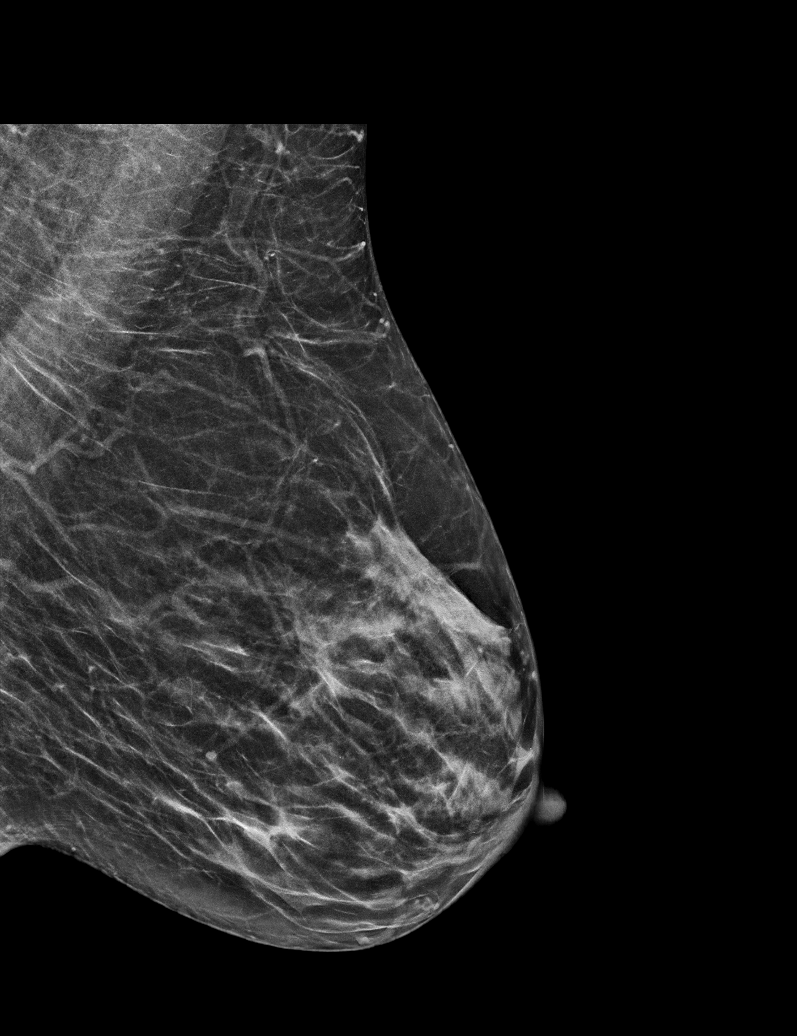

[R XCCL synth-2D]
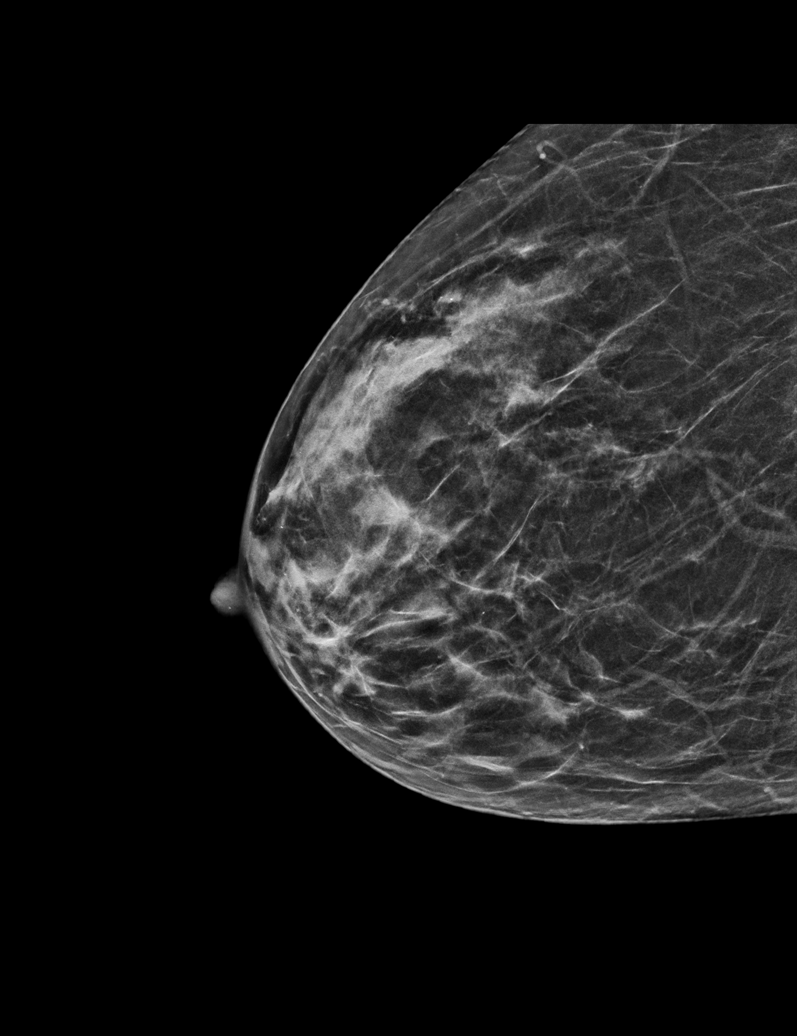

[R CC synth-2D]
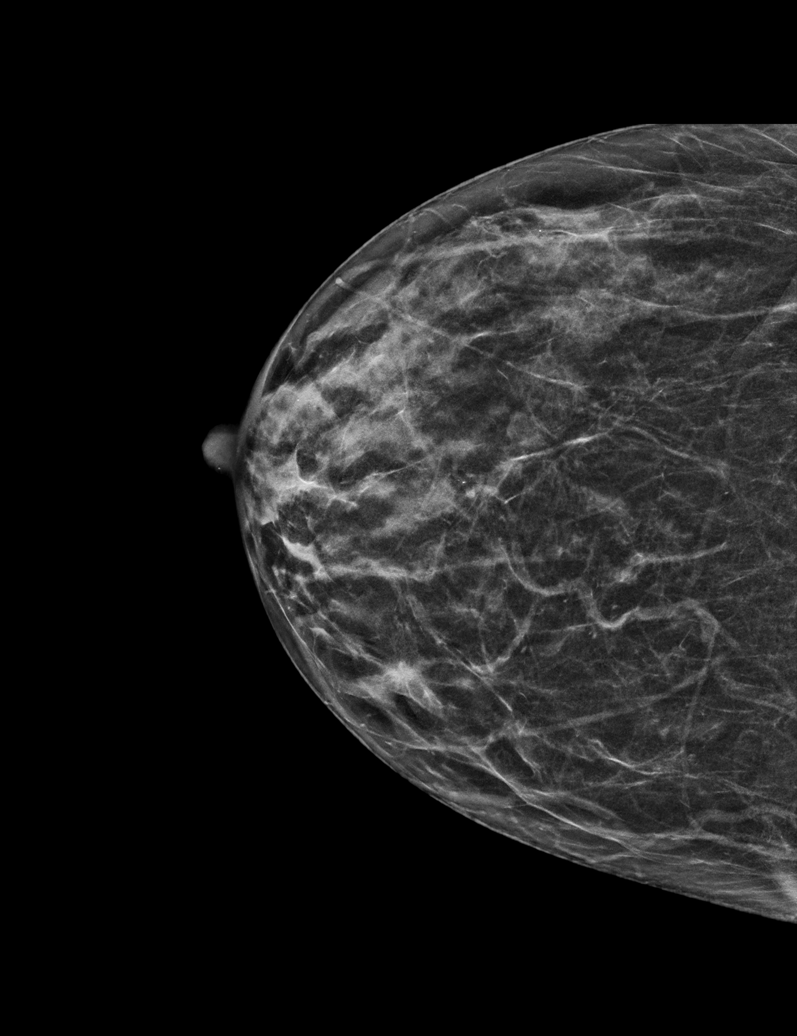

[L CC synth-2D]
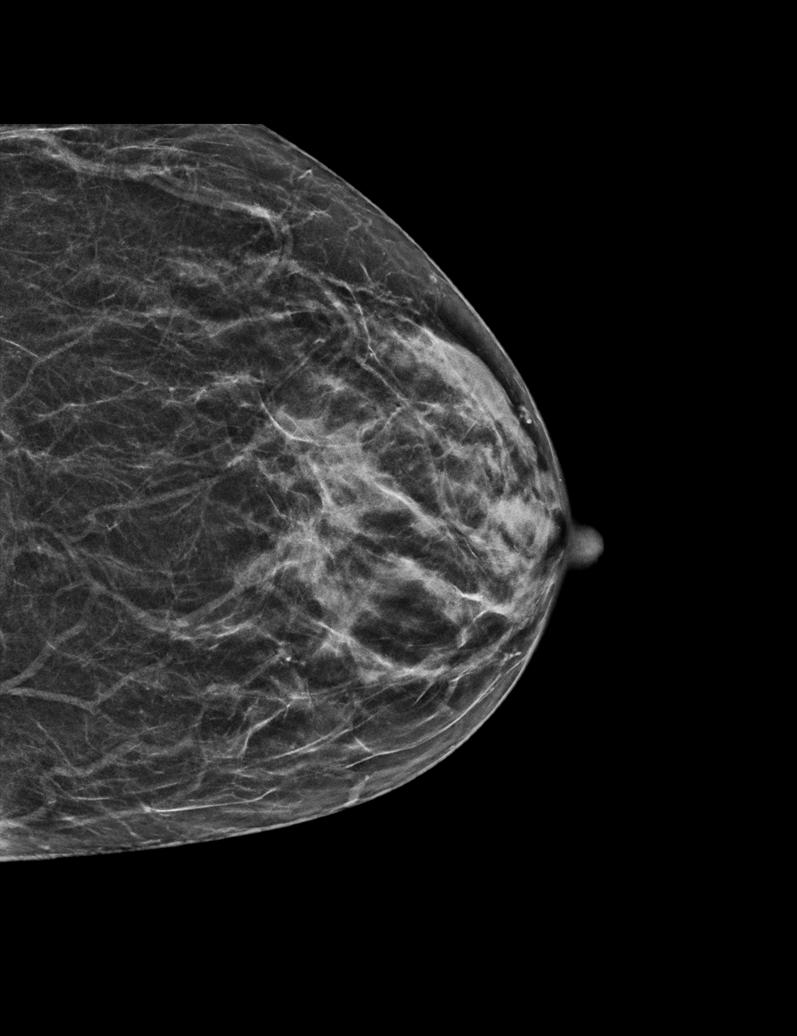

[R CC tomo · tomo slice 23/44.0]
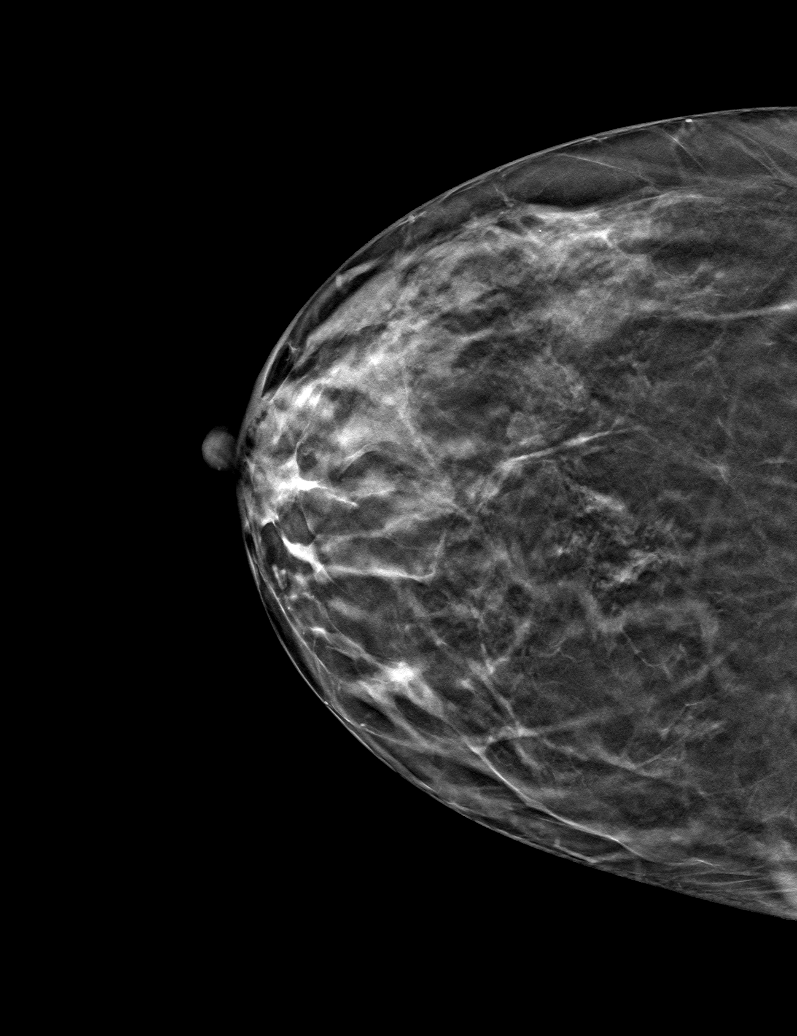

[6 of 30 positions shown; findings below may reference images not displayed]

ACR Breast Density Category b: There are scattered areas of
fibroglandular density.
FINDINGS: There are no findings suspicious for malignancy.
IMPRESSION: No mammographic evidence of malignancy. A result letter of this
screening mammogram will be mailed directly to the patient.

RECOMMENDATION:
Screening mammogram in one year. (Code:XG-X-X7B)

BI-RADS CATEGORY  1: Negative.

## 2023-11-18 NOTE — Progress Notes (Unsigned)
 58 y.o. G11P2002 Married Caucasian female here for annual exam.    Started using the vaginal estrogen cream in February.  She had spotting, bloating, and breast soreness. She bled for one day after using the estrogen for 4 days.   Now using one gram once a week.  No further bleeding.    Can have bleeding sometimes with sex, which is painful.  Using Uberlube and has tried cooking oil.     She requests no chaperone for her exam today.   PCP: Leesa Pulling, MD   Patient's last menstrual period was 09/28/2012.           Sexually active: Yes.    The current method of family planning is post menopausal status.    Menopausal hormone therapy:  Estrace  cream.  Exercising: Yes.     Walking and swimming Smoker:  Former   OB History  Gravida Para Term Preterm AB Living  2 2 2   2   SAB IAB Ectopic Multiple Live Births          # Outcome Date GA Lbr Len/2nd Weight Sex Type Anes PTL Lv  2 Term 03/2002    M CS-Unspec     1 Term 03/2000    F CS-Unspec        HEALTH MAINTENANCE: Last 2 paps:  08/22/21 neg HR HPV neg, 12/28/16 neg HPV neg History of abnormal Pap or positive HPV:  no Mammogram:   10/03/22 Breast Density Cat C, BIRADS Cat 1 neg  Colonoscopy:  10/12/21 polyps - due in 2028.  Bone Density:  n/a  Result  n/a   Immunization History  Administered Date(s) Administered   DTaP 08/04/2009   Influenza-Unspecified 07/25/2021      reports that she has quit smoking. Her smoking use included cigarettes. She has never used smokeless tobacco. She reports that she does not drink alcohol and does not use drugs.  Past Medical History:  Diagnosis Date   Abnormal uterine bleeding (AUB)    Allergy    Anxiety    Resolved after death of mother   Depression    Resolved after death of mother   Migraine    TMJ (temporomandibular joint disorder)    pt denies   Toxic thyroid  nodule 08/28/2015   Wears contact lenses    Wears glasses     Past Surgical History:  Procedure Laterality  Date   CESAREAN SECTION     x 2   DILATATION & CURETTAGE/HYSTEROSCOPY WITH MYOSURE N/A 11/25/2018   Procedure: DILATATION & CURETTAGE/HYSTEROSCOPY;  Surgeon: Wanita Gutta, MD;  Location: [facility];  Service: Gynecology;  Laterality: N/A;   FRACTURE SURGERY Right    ARM put to sleep to reset fracture   IUD REMOVAL  06/2018   WISDOM TOOTH EXTRACTION      Current Outpatient Medications  Medication Sig Dispense Refill   Multiple Vitamin (MULTIVITAMIN) tablet Take 1 tablet by mouth daily.     Omega-3 Fatty Acids (OMEGA 3 PO) Take by mouth.     SUMAtriptan (IMITREX) 100 MG tablet Take 1 tablet by mouth daily as needed for migraine.      topiramate (TOPAMAX) 100 MG tablet      VITAMIN D  PO Take by mouth.     estradiol  (ESTRACE ) 0.1 MG/GM vaginal cream 1 gram vaginally twice weekly (Patient not taking: Reported on 11/19/2023) 42.5 g 2   No current facility-administered medications for this visit.    ALLERGIES: Patient has no known  allergies.  Family History  Problem Relation Age of Onset   Breast cancer Mother    COPD Mother    Hypertension Father    Hypertension Paternal Grandmother    Diabetes Paternal Grandfather    Hypertension Paternal Grandfather    Kidney cancer Paternal Grandfather    Stroke Paternal Grandfather    Colon cancer Neg Hx    Esophageal cancer Neg Hx    Rectal cancer Neg Hx    Stomach cancer Neg Hx     Review of Systems  All other systems reviewed and are negative.   PHYSICAL EXAM:  BP 122/84 (BP Location: Left Arm, Patient Position: Sitting, Cuff Size: Normal)   Pulse 78   Ht 5' 4.25" (1.632 m)   Wt 141 lb (64 kg)   LMP 09/28/2012 Comment: some spotting 10-12-2018  SpO2 100%   BMI 24.01 kg/m     General appearance: alert, cooperative and appears stated age Head: normocephalic, without obvious abnormality, atraumatic Neck: no adenopathy, supple, symmetrical, trachea midline and thyroid  normal to inspection and palpation Lungs:  clear to auscultation bilaterally Breasts: normal appearance, no masses or tenderness, No nipple retraction or dimpling, No nipple discharge or bleeding, No axillary adenopathy Heart: regular rate and rhythm Abdomen: soft, non-tender; no masses, no organomegaly Extremities: extremities normal, atraumatic, no cyanosis or edema Skin: skin color, texture, turgor normal. No rashes or lesions Lymph nodes: cervical, supraclavicular, and axillary nodes normal. Neurologic: grossly normal  Pelvic: External genitalia:  no lesions              No abnormal inguinal nodes palpated.              Urethra:  normal appearing urethra with no masses, tenderness or lesions              Bartholins and Skenes: normal                 Vagina: normal appearing vagina with normal color and discharge, no lesions              Cervix: no lesions              Pap taken: no Bimanual Exam:  Uterus:  normal size, contour, position, consistency, mobility, non-tender              Adnexa: no mass, fullness, tenderness              Rectal exam: yes.  Confirms.              Anus:  normal sphincter tone, no lesions  Chaperone was present for exam:  declined by patient.   ASSESSMENT: Well woman visit with gynecologic exam. Vaginal atrophy.  FH breast cancer in mother.  PHQ-9: 0 Migraine HA.   PLAN: Mammogram screening discussed.  Has appt today.  Self breast awareness reviewed. Pap and HRV collected:  no.  Due in 2028. Guidelines for Calcium, Vitamin D , regular exercise program including cardiovascular and weight bearing exercise. Medication refills:  Refill of vaginal estradiol  cream.  I discussed potential effect on breast cancer.  [place] discussed.  Not on her formulary.  Labs with PCP.  Office visit for any future spontaneous vaginal bleeding.  Follow up:  yearly and prn.

## 2023-11-19 ENCOUNTER — Ambulatory Visit
Admission: RE | Admit: 2023-11-19 | Discharge: 2023-11-19 | Disposition: A | Discharge status: Home / Self Care | Source: Ambulatory Visit | Attending: Obstetrics and Gynecology | Admitting: Obstetrics and Gynecology

## 2023-11-19 ENCOUNTER — Encounter: Payer: Self-pay | Admitting: Obstetrics and Gynecology

## 2023-11-19 ENCOUNTER — Ambulatory Visit (INDEPENDENT_AMBULATORY_CARE_PROVIDER_SITE_OTHER): Payer: BC Managed Care – PPO | Admitting: Obstetrics and Gynecology

## 2023-11-19 VITALS — BP 122/84 | HR 78 | Ht 64.25 in | Wt 141.0 lb

## 2023-11-19 DIAGNOSIS — Z1331 Encounter for screening for depression: Secondary | ICD-10-CM | POA: Diagnosis not present

## 2023-11-19 DIAGNOSIS — N952 Postmenopausal atrophic vaginitis: Secondary | ICD-10-CM

## 2023-11-19 DIAGNOSIS — Z01419 Encounter for gynecological examination (general) (routine) without abnormal findings: Secondary | ICD-10-CM | POA: Diagnosis not present

## 2023-11-19 DIAGNOSIS — Z0000 Encounter for general adult medical examination without abnormal findings: Secondary | ICD-10-CM

## 2023-11-19 DIAGNOSIS — N941 Unspecified dyspareunia: Secondary | ICD-10-CM | POA: Diagnosis not present

## 2023-11-19 MED ORDER — ESTRADIOL 0.1 MG/GM VA CREA: TOPICAL_CREAM | VAGINAL | 2 refills | Status: AC

## 2023-11-19 NOTE — Patient Instructions (Signed)

## 2023-11-21 ENCOUNTER — Encounter: Payer: Self-pay | Admitting: Obstetrics and Gynecology

## 2024-05-06 ENCOUNTER — Telehealth: Payer: Self-pay

## 2024-05-06 ENCOUNTER — Other Ambulatory Visit: Payer: Self-pay | Admitting: Obstetrics and Gynecology

## 2024-05-06 ENCOUNTER — Ambulatory Visit: Admitting: Obstetrics and Gynecology

## 2024-05-06 ENCOUNTER — Encounter: Payer: Self-pay | Admitting: Obstetrics and Gynecology

## 2024-05-06 VITALS — BP 132/84 | HR 96

## 2024-05-06 DIAGNOSIS — N6452 Nipple discharge: Secondary | ICD-10-CM | POA: Diagnosis not present

## 2024-05-06 DIAGNOSIS — E2839 Other primary ovarian failure: Secondary | ICD-10-CM | POA: Diagnosis not present

## 2024-05-06 LAB — PROLACTIN: Prolactin: 9.2 ng/mL

## 2024-05-06 NOTE — Progress Notes (Signed)
   Acute Office Visit  Subjective:    Patient ID: Mary Bolton, female    DOB: 05-08-1966, 58 y.o.   MRN: [id number]   HPI 58 y.o. presents today for Office Visit (Discharge from R breast- First noticed 04/23/24. ) . Oct 9th noticed white discharge that turned to brown. MMG in May negative in May Has chronic headaches. Denies any changes in her peripheral vision Mother with breast cancer and osteoporosis Mother died of COPD at age 86 She is worried about cancer Patient's last menstrual period was 09/28/2012.    Review of Systems     Objective:    OBGyn Exam  B/l breast no rashes or skin changes, no expression on fluid No palpable masses BP 132/84 (BP Location: Left Arm, Patient Position: Sitting)   Pulse 96   LMP 09/28/2012 Comment: some spotting 10-12-2018  SpO2 98%  Wt Readings from Last 3 Encounters:  11/19/23 141 lb (64 kg)  08/21/23 143 lb (64.9 kg)  10/03/22 141 lb (64 kg)      Assessment & Plan:  Breast discharge To check prolactin today Diagnostic breast US   To follow with Dr. Nikki when results return  Mary Bolton

## 2024-05-06 NOTE — Telephone Encounter (Signed)
 Patient called and states that she has an appointment at the breast center tomorrow but she wanted to know if she should wait for labs to come back. Please advise.

## 2024-05-07 ENCOUNTER — Ambulatory Visit
Admission: RE | Admit: 2024-05-07 | Discharge: 2024-05-07 | Disposition: A | Discharge status: Home / Self Care | Source: Ambulatory Visit | Attending: Obstetrics and Gynecology | Admitting: Obstetrics and Gynecology

## 2024-05-07 ENCOUNTER — Ambulatory Visit: Payer: Self-pay | Admitting: Obstetrics and Gynecology

## 2024-05-07 DIAGNOSIS — N6452 Nipple discharge: Secondary | ICD-10-CM

## 2024-05-11 ENCOUNTER — Telehealth (HOSPITAL_BASED_OUTPATIENT_CLINIC_OR_DEPARTMENT_OTHER): Payer: Self-pay

## 2024-11-19 ENCOUNTER — Ambulatory Visit: Admitting: Obstetrics and Gynecology
